# Patient Record
Sex: Male | Born: 1995 | Race: Black or African American | Hispanic: No | Marital: Single | Smoking: Current every day smoker
Health system: Southern US, Community
[De-identification: ages and names within clinical notes are randomized; demographics above are authoritative.]

## PROBLEM LIST (undated history)

## (undated) DIAGNOSIS — K529 Noninfective gastroenteritis and colitis, unspecified: Secondary | ICD-10-CM

## (undated) DIAGNOSIS — Z9089 Acquired absence of other organs: Secondary | ICD-10-CM

## (undated) DIAGNOSIS — R041 Hemorrhage from throat: Secondary | ICD-10-CM

## (undated) DIAGNOSIS — T8859XA Other complications of anesthesia, initial encounter: Secondary | ICD-10-CM

## (undated) DIAGNOSIS — T4145XA Adverse effect of unspecified anesthetic, initial encounter: Secondary | ICD-10-CM

## (undated) DIAGNOSIS — K610 Anal abscess: Secondary | ICD-10-CM

## (undated) DIAGNOSIS — R1084 Generalized abdominal pain: Secondary | ICD-10-CM

## (undated) DIAGNOSIS — K921 Melena: Secondary | ICD-10-CM

## (undated) DIAGNOSIS — I10 Essential (primary) hypertension: Secondary | ICD-10-CM

## (undated) DIAGNOSIS — K611 Rectal abscess: Secondary | ICD-10-CM

## (undated) DIAGNOSIS — J039 Acute tonsillitis, unspecified: Secondary | ICD-10-CM

## (undated) HISTORY — DX: Rectal abscess: K61.1

## (undated) HISTORY — DX: Noninfective gastroenteritis and colitis, unspecified: K52.9

## (undated) HISTORY — DX: Anal abscess: K61.0

## (undated) HISTORY — DX: Melena: K92.1

## (undated) HISTORY — DX: Acquired absence of other organs: Z90.89

## (undated) HISTORY — DX: Generalized abdominal pain: R10.84

## (undated) HISTORY — DX: Hemorrhage from throat: R04.1

## (undated) HISTORY — DX: Essential (primary) hypertension: I10

## (undated) SURGERY — Surgical Case
Anesthesia: *Unknown

---
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
## 2014-01-17 ENCOUNTER — Emergency Department: Payer: Self-pay | Admitting: Emergency Medicine

## 2014-01-17 LAB — URINALYSIS, COMPLETE
BILIRUBIN, UR: NEGATIVE
Bacteria: NONE SEEN
Blood: NEGATIVE
GLUCOSE, UR: NEGATIVE mg/dL (ref 0–75)
Ketone: NEGATIVE
LEUKOCYTE ESTERASE: NEGATIVE
NITRITE: NEGATIVE
PROTEIN: NEGATIVE
Ph: 6 (ref 4.5–8.0)
RBC,UR: 1 /HPF (ref 0–5)
SPECIFIC GRAVITY: 1.024 (ref 1.003–1.030)
Squamous Epithelial: <1

## 2014-01-17 LAB — COMPREHENSIVE METABOLIC PANEL
ALT: 27 U/L (ref 12–78)
ANION GAP: 5 — AB (ref 7–16)
Albumin: 3.6 g/dL — ABNORMAL LOW (ref 3.8–5.6)
Alkaline Phosphatase: 93 U/L
BUN: 13 mg/dL (ref 9–21)
Bilirubin,Total: 0.3 mg/dL (ref 0.2–1.0)
CALCIUM: 9 mg/dL (ref 9.0–10.7)
Chloride: 106 mmol/L (ref 97–107)
Co2: 25 mmol/L (ref 16–25)
Creatinine: 1.22 mg/dL (ref 0.60–1.30)
EGFR (African American): >60
EGFR (Non-African Amer.): >60
GLUCOSE: 82 mg/dL (ref 65–99)
Osmolality: 271 (ref 275–301)
Potassium: 3.9 mmol/L (ref 3.3–4.7)
SGOT(AST): 24 U/L (ref 10–41)
Sodium: 136 mmol/L (ref 132–141)
Total Protein: 7.8 g/dL (ref 6.4–8.6)

## 2014-01-17 LAB — CBC
HCT: 41.6 % (ref 40.0–52.0)
HGB: 13.7 g/dL (ref 13.0–18.0)
MCH: 27.6 pg (ref 26.0–34.0)
MCHC: 33 g/dL (ref 32.0–36.0)
MCV: 84 fL (ref 80–100)
PLATELETS: 392 10*3/uL (ref 150–440)
RBC: 4.99 10*6/uL (ref 4.40–5.90)
RDW: 14.2 % (ref 11.5–14.5)
WBC: 13.5 10*3/uL — ABNORMAL HIGH (ref 3.8–10.6)

## 2014-01-17 LAB — LIPASE, BLOOD: LIPASE: 93 U/L (ref 73–393)

## 2014-04-27 ENCOUNTER — Emergency Department: Payer: Self-pay | Admitting: Emergency Medicine

## 2014-04-27 LAB — MONONUCLEOSIS SCREEN: Mono Test: POSITIVE

## 2014-04-30 LAB — BETA STREP CULTURE(ARMC)

## 2014-05-01 ENCOUNTER — Emergency Department: Payer: Self-pay | Admitting: Emergency Medicine

## 2014-12-03 ENCOUNTER — Observation Stay: Payer: Self-pay | Admitting: Surgery

## 2014-12-03 LAB — CBC WITH DIFFERENTIAL/PLATELET
Basophil #: 0.1 10*3/uL (ref 0.0–0.1)
Basophil %: 0.7 %
EOS ABS: 0 10*3/uL (ref 0.0–0.7)
EOS PCT: 0.1 %
HCT: 47.2 % (ref 40.0–52.0)
HGB: 14.9 g/dL (ref 13.0–18.0)
LYMPHS ABS: 2.7 10*3/uL (ref 1.0–3.6)
Lymphocyte %: 17.5 %
MCH: 27 pg (ref 26.0–34.0)
MCHC: 31.7 g/dL — ABNORMAL LOW (ref 32.0–36.0)
MCV: 85 fL (ref 80–100)
MONO ABS: 1.4 x10 3/mm — AB (ref 0.2–1.0)
Monocyte %: 9.1 %
Neutrophil #: 11.4 10*3/uL — ABNORMAL HIGH (ref 1.4–6.5)
Neutrophil %: 72.6 %
Platelet: 387 10*3/uL (ref 150–440)
RBC: 5.52 10*6/uL (ref 4.40–5.90)
RDW: 13.2 % (ref 11.5–14.5)
WBC: 15.7 10*3/uL — ABNORMAL HIGH (ref 3.8–10.6)

## 2014-12-03 LAB — BASIC METABOLIC PANEL
Anion Gap: 9 (ref 7–16)
BUN: 9 mg/dL (ref 7–18)
CO2: 25 mmol/L (ref 21–32)
CREATININE: 1.24 mg/dL (ref 0.60–1.30)
Calcium, Total: 9.2 mg/dL (ref 9.0–10.7)
Chloride: 104 mmol/L (ref 98–107)
EGFR (Non-African Amer.): >60
GLUCOSE: 103 mg/dL — AB (ref 65–99)
OSMOLALITY: 275 (ref 275–301)
Potassium: 3.4 mmol/L — ABNORMAL LOW (ref 3.5–5.1)
SODIUM: 138 mmol/L (ref 136–145)

## 2014-12-03 LAB — URINALYSIS, COMPLETE
BACTERIA: NONE SEEN
Bilirubin,UR: NEGATIVE
Blood: NEGATIVE
GLUCOSE, UR: NEGATIVE mg/dL (ref 0–75)
Ketone: NEGATIVE
LEUKOCYTE ESTERASE: NEGATIVE
NITRITE: NEGATIVE
Ph: 6 (ref 4.5–8.0)
Protein: NEGATIVE
RBC,UR: 1 /HPF (ref 0–5)
SPECIFIC GRAVITY: 1.018 (ref 1.003–1.030)
Squamous Epithelial: <1

## 2014-12-08 HISTORY — PX: INCISION AND DRAINAGE PERIRECTAL ABSCESS: SHX1804

## 2014-12-09 LAB — WOUND CULTURE

## 2015-03-31 NOTE — H&P (Signed)
Subjective/Chief Complaint perianal pain   History of Present Illness 19 y/o obese male presents with 3 day history of increasing perianal pain and swelling along with painful defecation, no dysuria.  Seen in local urgent care and diagnosed with prostatitis and started on doxycycline.  Broke out in rash after three pills.  Came to ER with persistent pain.  Febrile in ER to 101.6 wbc elevated and CT scan shows small perianal anterior abscess.  Surgery consulted.  No alleviating factors.   Past History obesity   Code Status Full Code   Past Med/Surgical Hx:  Denies medical history:   denies surg hx:   ALLERGIES:  Doxycycline: Tachycardia, SOB, Swelling   Other Allergies none   HOME MEDICATIONS: Medication Instructions Status  doxycycline hyclate hyclate 100 mg oral tablet 1 tab(s) orally 2 times a day x 10 days. *pt is stopping medication due to allergic reaction 12/03/14 [facility]* Active    Medications none   Family and Social History:  Family History Non-Contributory   Social History negative tobacco, negative ETOH, negative Illicit drugs   Place of Living Home   Review of Systems:  Subjective/Chief Complaint see above   Fever/Chills Yes   Abdominal Pain No   Diarrhea No   Constipation No   Nausea/Vomiting No   SOB/DOE No   Chest Pain No   Dysuria No   Tolerating Diet Yes  ate at 1 pm today.   ROS remaining ten point negative.   Medications/Allergies Reviewed Medications/Allergies reviewed   Physical Exam:  GEN no acute distress, obese, disheveled, 101.6 VSS   HEENT pale conjunctivae, PERRL, hearing intact to voice, moist oral mucosa, good dentition   NECK supple  No masses  trachea midline   RESP normal resp effort  clear BS  no use of accessory muscles   CARD regular rate  no murmur   ABD denies tenderness  denies Flank Tenderness  no liver/spleen enlargement  no hernia  soft  normal BS   EXTR negative cyanosis/clubbing, negative edema    SKIN normal to palpation, No rashes   NEURO cranial nerves intact   PSYCH A+O to time, place, person, good insight   Additional Comments tender along anterior edge of rectum and along the anterior perineal body   Lab Results: Routine Chem:  27-Dec-15 18:14   Glucose, Serum  103  BUN 9  Creatinine (comp) 1.24  Sodium, Serum 138  Potassium, Serum  3.4  Chloride, Serum 104  CO2, Serum 25  Calcium (Total), Serum 9.2  Anion Gap 9  Osmolality (calc) 275  eGFR (African American) >60  eGFR (Non-African American) >60 (eGFR values <92m/min/1.73 m2 may be an indication of chronic kidney disease (CKD). Calculated eGFR, using the MRDR Study equation, is useful in  patients with stable renal function. The eGFR calculation will not be reliable in acutely ill patients when serum creatinine is changing rapidly. It is not useful in patients on dialysis. The eGFR calculation may not be applicable to patients at the low and high extremes of body sizes, pregnant women, and vegetarians.)  Routine UA:  27-Dec-15 19:35   Color (UA) Yellow  Clarity (UA) Clear  Glucose (UA) Negative  Bilirubin (UA) Negative  Ketones (UA) Negative  Specific Gravity (UA) 1.018  Blood (UA) Negative  pH (UA) 6.0  Protein (UA) Negative  Nitrite (UA) Negative  Leukocyte Esterase (UA) Negative (Result(s) reported on 03 Dec 2014 at 07:53PM.)  RBC (UA) 1 /HPF  WBC (UA) 1 /HPF  Bacteria (  UA) NONE SEEN  Epithelial Cells (UA) <1 /HPF  Mucous (UA) PRESENT (Result(s) reported on 03 Dec 2014 at 07:53PM.)  Routine Hem:  27-Dec-15 18:14   WBC (CBC)  15.7  RBC (CBC) 5.52  Hemoglobin (CBC) 14.9  Hematocrit (CBC) 47.2  Platelet Count (CBC) 387  MCV 85  MCH 27.0  MCHC  31.7  RDW 13.2  Neutrophil % 72.6  Lymphocyte % 17.5  Monocyte % 9.1  Eosinophil % 0.1  Basophil % 0.7  Neutrophil #  11.4  Lymphocyte # 2.7  Monocyte #  1.4  Eosinophil # 0.0  Basophil # 0.1 (Result(s) reported on 03 Dec 2014 at 06:37PM.)    Radiology Results: LabUnknown:    27-Dec-15 20:09, CT Abdomen and Pelvis With Contrast  PACS Image  CT:  CT Abdomen and Pelvis With Contrast  REASON FOR EXAM:    (1) abdominal/pelvic pain - R abd into r groin; (2)   abdominal/pelvic pain - R ab  COMMENTS:       PROCEDURE: CT  - CT ABDOMEN / PELVIS  W  - Dec 03 2014  8:09PM     CLINICAL DATA:  Right abdomen and pelvic pain extending into right  groin. Currently being treated for prostate infection.    EXAM:  CT ABDOMEN AND PELVIS WITH CONTRAST    TECHNIQUE:  Multidetector CT imaging of the abdomen and pelvis was performed  using the standard protocol following bolus administration of  intravenous contrast.    CONTRAST:  125 cc Omnipaque 300 IV.    COMPARISON:  01/17/2014    FINDINGS:  Lung bases are clear.  No effusions.  Heart is normal size.    Liver, gallbladder, spleen, pancreas, adrenals and kidneys are  normal. Appendix is visualized and is normal. Stomach, large and  small bowel are unremarkable. Aorta is normal caliber. No free  fluid, free air or adenopathy. Urinary bladder is decompressed,  grossly unremarkable.  There is a fluid collection noted anterior to the anus near the  gluteal fold concerning for perianal abscess. A few locules of gas  noted within the fluid collection. This measures approximately 4.4 x  3.9 cm.    No acute bony abnormality.     IMPRESSION:  4.4 cm irregular fluid collection with locules ofgas anterior to  the anus concerning for perianal abscess.      Electronically Signed    By: Rolm Baptise M.D.    On: 12/03/2014 20:32     Verified By: Raelyn Number, M.D.,    Assessment/Admission Diagnosis 19 y/o with perianal abscess   Plan admit, NPO after MN, IV abx, plan I and D in am with Dr Rexene Edison.   Electronic Signatures: Sherri Rad (MD)  (Signed 27-Dec-15 22:23)  Authored: CHIEF COMPLAINT and HISTORY, PAST MEDICAL/SURGIAL HISTORY, ALLERGIES, Other Allergies, HOME  MEDICATIONS, OTHER MEDICATIONS, FAMILY AND SOCIAL HISTORY, REVIEW OF SYSTEMS, PHYSICAL EXAM, LABS, Radiology, ASSESSMENT AND PLAN   Last Updated: 27-Dec-15 22:23 by Sherri Rad (MD)

## 2015-04-04 NOTE — Op Note (Signed)
PATIENT NAME:  Ronnie Smith, Ronnie Smith MR#:  [id number] DATE OF BIRTH:  01/18/1996  DATE OF PROCEDURE:  12/04/2014  SURGEON: Harrell Gave A. Alaisha Eversley, MD  PREOPERATIVE DIAGNOSIS: Perianal abscess.   POSTOPERATIVE DIAGNOSIS: Perianal abscess.   PROCEDURE PERFORMED: Rectal examination under anesthesia, incision and drainage of perineal abscess.   ANESTHESIA: General.   ESTIMATED BLOOD LOSS: 10 mL.   COMPLICATIONS: None.   SPECIMEN: Wound culture for Gram stain and sensitivity.   INDICATION FOR ADMISSION: Ronnie Smith is a pleasant 19 year old with history of prostate infection who presents with groin and perianal pain. He had a CT scan which showed a 5 cm perianal abscess. He was thus brought to the operating room suite for I and D of abscess.   DETAILS OF PROCEDURE: As follows: Informed consent was obtained. Ronnie Smith was brought to the operating room suite. He was induced, endotracheal tube was placed, general anesthesia was administered. His legs were put up in stirrups. His perineum was prepped and draped in standard surgical fashion. An incision was made to the right anterior aspect of his perineum. The incision was deepened down into a pus cavity which appeared to go laterally onto the patient's left side. A Penrose drain was put into the cavity. All loculations were broken up. The cavity was then packed with iodoform gauze and the Penrose drain was sutured with a 3-0 nylon suture. A dressing was placed over the wound. The patient was then awoken, extubated and brought to the postanesthesia care unit. There were no immediate complications. Needle, sponge and instrument count was correct at the end of the procedure.    ____________________________ Glena Norfolk. Akshara Blumenthal, MD cal:TT D: 12/04/2014 17:59:34 ET T: 12/04/2014 21:15:32 ET JOB#: [id number]  cc: Harrell Gave A. Kitai Purdom, MD, <Dictator> Floyde Parkins MD ELECTRONICALLY SIGNED 12/19/2014 19:46

## 2015-04-17 ENCOUNTER — Inpatient Hospital Stay
Admission: EM | Admit: 2015-04-17 | Discharge: 2015-04-23 | DRG: 330 | Disposition: A | Discharge status: Home / Self Care | Payer: BC Managed Care – PPO | Attending: Surgery | Admitting: Surgery

## 2015-04-17 ENCOUNTER — Encounter: Payer: Self-pay | Admitting: Emergency Medicine

## 2015-04-17 DIAGNOSIS — Z881 Allergy status to other antibiotic agents status: Secondary | ICD-10-CM | POA: Diagnosis not present

## 2015-04-17 DIAGNOSIS — K611 Rectal abscess: Secondary | ICD-10-CM

## 2015-04-17 DIAGNOSIS — R509 Fever, unspecified: Secondary | ICD-10-CM

## 2015-04-17 DIAGNOSIS — Z6841 Body Mass Index (BMI) 40.0 and over, adult: Secondary | ICD-10-CM | POA: Diagnosis not present

## 2015-04-17 HISTORY — DX: Rectal abscess: K61.1

## 2015-04-17 LAB — BASIC METABOLIC PANEL
ANION GAP: 6 (ref 5–15)
BUN: 17 mg/dL (ref 6–20)
CO2: 24 mmol/L (ref 22–32)
Calcium: 9 mg/dL (ref 8.9–10.3)
Chloride: 108 mmol/L (ref 101–111)
Creatinine, Ser: 1.18 mg/dL (ref 0.61–1.24)
GFR calc Af Amer: >60 mL/min (ref >60)
GLUCOSE: 97 mg/dL (ref 65–99)
POTASSIUM: 3.8 mmol/L (ref 3.5–5.1)
SODIUM: 138 mmol/L (ref 135–145)

## 2015-04-17 LAB — CBC WITH DIFFERENTIAL/PLATELET
BASOS ABS: 0 10*3/uL (ref 0–0.1)
Basophils Relative: 0 %
Eosinophils Absolute: 0 10*3/uL (ref 0–0.7)
Eosinophils Relative: 0 %
HCT: 47.2 % (ref 40.0–52.0)
HEMOGLOBIN: 15.2 g/dL (ref 13.0–18.0)
LYMPHS PCT: 19 %
Lymphs Abs: 2.1 10*3/uL (ref 1.0–3.6)
MCH: 27.2 pg (ref 26.0–34.0)
MCHC: 32.1 g/dL (ref 32.0–36.0)
MCV: 84.7 fL (ref 80.0–100.0)
Monocytes Absolute: 1.5 10*3/uL — ABNORMAL HIGH (ref 0.2–1.0)
Monocytes Relative: 13 %
NEUTROS ABS: 7.5 10*3/uL — AB (ref 1.4–6.5)
NEUTROS PCT: 68 %
Platelets: 350 10*3/uL (ref 150–440)
RBC: 5.58 MIL/uL (ref 4.40–5.90)
RDW: 13.6 % (ref 11.5–14.5)
WBC: 11.1 10*3/uL — AB (ref 3.8–10.6)

## 2015-04-17 LAB — HEPATIC FUNCTION PANEL
ALBUMIN: 4.6 g/dL (ref 3.5–5.0)
ALT: 32 U/L (ref 17–63)
AST: 25 U/L (ref 15–41)
Alkaline Phosphatase: 67 U/L (ref 38–126)
BILIRUBIN DIRECT: 0.1 mg/dL (ref 0.1–0.5)
Indirect Bilirubin: 0.3 mg/dL (ref 0.3–0.9)
Total Bilirubin: 0.4 mg/dL (ref 0.3–1.2)
Total Protein: 7.6 g/dL (ref 6.5–8.1)

## 2015-04-17 MED ORDER — DEXTROSE 5 % IV SOLN
1.0000 g | Freq: Four times a day (QID) | INTRAVENOUS | Status: DC
  Administered 2015-04-18 – 2015-04-23 (×21): 1 g via INTRAVENOUS
  Filled 2015-04-17 (×29): qty 1

## 2015-04-17 MED ORDER — MORPHINE SULFATE 4 MG/ML IJ SOLN
4.0000 mg | Freq: Once | INTRAMUSCULAR | Status: AC
Start: 2015-04-17 — End: 2015-04-17
  Administered 2015-04-17: 4 mg via INTRAVENOUS

## 2015-04-17 MED ORDER — ACETAMINOPHEN 325 MG PO TABS
650.0000 mg | ORAL_TABLET | Freq: Four times a day (QID) | ORAL | Status: DC | PRN
  Administered 2015-04-18 – 2015-04-21 (×5): 650 mg via ORAL
  Filled 2015-04-17: qty 1
  Filled 2015-04-17 (×5): qty 2

## 2015-04-17 MED ORDER — PIPERACILLIN-TAZOBACTAM 3.375 G IVPB
3.3750 g | Freq: Once | INTRAVENOUS | Status: AC
  Administered 2015-04-17: 3.375 g via INTRAVENOUS

## 2015-04-17 MED ORDER — VANCOMYCIN HCL 10 G IV SOLR
1.0000 g | Freq: Once | INTRAVENOUS | Status: DC
  Filled 2015-04-17: qty 1000

## 2015-04-17 MED ORDER — VANCOMYCIN HCL IN DEXTROSE 1-5 GM/200ML-% IV SOLN
INTRAVENOUS | Status: AC
  Administered 2015-04-17: 1000 mg via INTRAVENOUS
  Filled 2015-04-17: qty 200

## 2015-04-17 MED ORDER — PIPERACILLIN-TAZOBACTAM 3.375 G IVPB
INTRAVENOUS | Status: AC
  Administered 2015-04-17: 3.375 g via INTRAVENOUS
  Filled 2015-04-17: qty 50

## 2015-04-17 MED ORDER — MORPHINE SULFATE 4 MG/ML IJ SOLN
4.0000 mg | INTRAMUSCULAR | Status: DC | PRN
  Administered 2015-04-18 – 2015-04-20 (×8): 4 mg via INTRAVENOUS
  Filled 2015-04-17 (×8): qty 1

## 2015-04-17 MED ORDER — SODIUM CHLORIDE 0.9 % IV SOLN
INTRAVENOUS | Status: DC
  Administered 2015-04-17 (×3): via INTRAVENOUS

## 2015-04-17 MED ORDER — HEPARIN SODIUM (PORCINE) 5000 UNIT/ML IJ SOLN
5000.0000 [IU] | Freq: Three times a day (TID) | INTRAMUSCULAR | Status: DC
  Administered 2015-04-17 – 2015-04-23 (×16): 5000 [IU] via SUBCUTANEOUS
  Filled 2015-04-17 (×18): qty 1

## 2015-04-17 MED ORDER — MORPHINE SULFATE 4 MG/ML IJ SOLN: 4.0000 mg | Freq: Once | INTRAMUSCULAR | Status: DC

## 2015-04-17 MED ORDER — SODIUM CHLORIDE 0.9 % IV SOLN: INTRAVENOUS | Status: DC

## 2015-04-17 MED ORDER — ACETAMINOPHEN 325 MG PO TABS
650.0000 mg | ORAL_TABLET | Freq: Once | ORAL | Status: AC
  Administered 2015-04-17: 650 mg via ORAL

## 2015-04-17 MED ORDER — ACETAMINOPHEN 650 MG RE SUPP: 650.0000 mg | Freq: Four times a day (QID) | RECTAL | Status: DC | PRN

## 2015-04-17 MED ORDER — LACTATED RINGERS IV SOLN
INTRAVENOUS | Status: DC
  Administered 2015-04-18 (×2): via INTRAVENOUS

## 2015-04-17 MED ORDER — ACETAMINOPHEN 325 MG PO TABS
ORAL_TABLET | ORAL | Status: AC
  Administered 2015-04-17: 650 mg via ORAL
  Filled 2015-04-17: qty 2

## 2015-04-17 MED ORDER — MORPHINE SULFATE 4 MG/ML IJ SOLN
INTRAMUSCULAR | Status: AC
  Filled 2015-04-17: qty 1

## 2015-04-17 MED ORDER — MORPHINE SULFATE 4 MG/ML IJ SOLN
INTRAMUSCULAR | Status: AC
  Administered 2015-04-17: 4 mg via INTRAVENOUS
  Filled 2015-04-17: qty 1

## 2015-04-17 MED ORDER — ONDANSETRON HCL 4 MG/2ML IJ SOLN
4.0000 mg | Freq: Once | INTRAMUSCULAR | Status: AC
  Administered 2015-04-17: 4 mg via INTRAVENOUS

## 2015-04-17 MED ORDER — ONDANSETRON HCL 4 MG/2ML IJ SOLN
INTRAMUSCULAR | Status: AC
  Filled 2015-04-17: qty 2

## 2015-04-17 MED ORDER — VANCOMYCIN HCL IN DEXTROSE 1-5 GM/200ML-% IV SOLN
1000.0000 mg | Freq: Once | INTRAVENOUS | Status: AC
  Administered 2015-04-17: 1000 mg via INTRAVENOUS

## 2015-04-17 MED ORDER — OXYCODONE-ACETAMINOPHEN 5-325 MG PO TABS
1.0000 | ORAL_TABLET | ORAL | Status: DC | PRN
  Administered 2015-04-18 – 2015-04-20 (×2): 1 via ORAL
  Filled 2015-04-17 (×2): qty 1

## 2015-04-17 MED ORDER — ONDANSETRON HCL 4 MG/2ML IJ SOLN
INTRAMUSCULAR | Status: AC
  Administered 2015-04-17: 4 mg via INTRAVENOUS
  Filled 2015-04-17: qty 2

## 2015-04-17 MED ORDER — ONDANSETRON HCL 4 MG/2ML IJ SOLN: 4.0000 mg | Freq: Once | INTRAMUSCULAR | Status: DC

## 2015-04-17 NOTE — ED Notes (Signed)
Pt reports that he has an abscess in his perirectal area, he has had this three times and has had surgery all three times.

## 2015-04-17 NOTE — ED Provider Notes (Signed)
[facility] Emergency Department Provider Note  ____________________________________________  Time seen: Approximately 3:20 PM  I have reviewed the triage vital signs and the nursing notes.   HISTORY  Chief Complaint Abscess    HPI Ronnie Smith is a 19 y.o. male who is brought to the emergency department by his mother with a chief complaint of fever and pain and pain in the perirectal area.  Pain is increased on the left side of the rectum onset of pain was 3 days ago pain is rated as 8 out of 10 in severity, pain is constant, pain is sharp and aching.  Patient has a history of a perirectal abscess on the right side of the rectum diagnosed in December 2015 which required surgical resection and insertion of drain. He was hospitalized through December to January. He had a train that was removed and changed to another area on an outpatient basis by the surgeon. The second drain was placed 3 weeks ago by the surgeon. The patient removed the drain himself because it was not draining any more fluid.  3 days ago he started to have this increased pain. This is associated with fever. And nausea and vomiting. He is also complaining of sweats and chills for the past 2 days.   PMH: Perirectal abcess    There are no active problems to display for this patient.   PSH: Perirectal abcess  No current outpatient prescriptions on file.  Allergies Doxycycline  No family history on file.  Social History History  Substance Use Topics  . Smoking status: Never Smoker   . Smokeless tobacco: Not on file  . Alcohol Use: No    Review of Systems Constitutional: No fever/chills Eyes: No visual changes. ENT: No sore throat. Cardiovascular: Denies chest pain. Respiratory: Denies shortness of breath. Gastrointestinal: No abdominal pain. Positive nausea and vomiting for 2 days.  Positive diarrhea.  No constipation. Genitourinary: Negative for  dysuria. Musculoskeletal: Negative for back pain. Skin: Negative for rash. Neurological: Negative for headaches, focal weakness or numbness.  10-point ROS otherwise negative.  ____________________________________________   PHYSICAL EXAM:  VITAL SIGNS: ED Triage Vitals  Enc Vitals Group     BP --      Pulse Rate 04/17/15 1032 112     Resp --      Temp 04/17/15 1032 100.7 F (38.2 C)     Temp Source 04/17/15 1032 Oral     SpO2 04/17/15 1032 100 %     Weight 04/17/15 1003 323 lb (146.512 kg)     Height 04/17/15 1003 6\' 1"  (1.854 m)     Head Cir --      Peak Flow --      Pain Score 04/17/15 1003 8     Pain Loc --      Pain Edu? --      Excl. in [place]? --   Show vital signs on arrival to the triage area revealed temperature of 100.7 patient is tachycardic at 112 O2 sat is normal at 100%.  In the emergency department his temperature rose to 103.1 and his heart rate went up to 1:30 consistent with tachycardia and fever.   Constitutional: Alert and oriented. Appears sick in mild acute distress. Diaphoretic. Eyes: Conjunctivae are normal. PERRL. EOMI. Head: Atraumatic. Nose: No congestion/rhinnorhea. Mouth/Throat: Mucous membranes are moist.  Oropharynx non-erythematous. Neck: No stridor.  Cardiovascular: Normal rate, regular rhythm. Grossly normal heart sounds.  Good peripheral circulation. Respiratory: Normal respiratory effort.  No retractions. Lungs  CTAB. Gastrointestinal: Soft and nontender. No distention. No abdominal bruits. No CVA tenderness. Musculoskeletal: No lower extremity tenderness nor edema.  No joint effusions. Neurologic:  Normal speech and language. No gross focal neurologic deficits are appreciated. Speech is normal. No gait instability. Skin:  Skin is warm, dry and intact. No rash noted. Psychiatric: Mood and affect are normal. Speech and behavior are normal. Rectal slight tenderness in the left perirectal area. No redness not hard or in indurated. No  drainage. ____________________________________________   LABS (all labs ordered are listed, but only abnormal results are displayed)  Labs Reviewed  CBC WITH DIFFERENTIAL/PLATELET - Abnormal; Notable for the following:    WBC 11.1 (*)    Neutro Abs 7.5 (*)    Monocytes Absolute 1.5 (*)    All other components within normal limits  BASIC METABOLIC PANEL   Labs Reviewed  CBC WITH DIFFERENTIAL/PLATELET - Abnormal; Notable for the following:    WBC 11.1 (*)    Neutro Abs 7.5 (*)    Monocytes Absolute 1.5 (*)    All other components within normal limits  CULTURE, BLOOD (ROUTINE X 2)  CULTURE, BLOOD (ROUTINE X 2)  BASIC METABOLIC PANEL  HEPATIC FUNCTION PANEL   Labs Reviewed  CBC WITH DIFFERENTIAL/PLATELET - Abnormal; Notable for the following:    WBC 11.1 (*)    Neutro Abs 7.5 (*)    Monocytes Absolute 1.5 (*)    All other components within normal limits  CULTURE, BLOOD (ROUTINE X 2)  CULTURE, BLOOD (ROUTINE X 2)  BASIC METABOLIC PANEL  HEPATIC FUNCTION PANEL  BASIC METABOLIC PANEL  CBC   labs were reviewed in the emergency department blood cultures were drawn and are pending his liver function tests are normal a sick metabolic panel was normal CBC is positive for an elevated white count of 11.1 with a shift to the left.  EKG ED ECG REPORT   Date: 04/17/2015  EKG Time: 16:44  Rate: 120 sick  Rhythm: normal EKG, normal sinus rhythm, unchanged from previous tracings, sinus tachycardia  Axis: Normal  Intervals:none  ST&T Change: Nonspecific ST-T wave changes.   ____________________________________________  RADIOLOG's  CAT scan of the abdomen and pelvis with contrast was ordered by me in the emergency department as per Dr. Burt Knack, surgeon, request this order was [place]. Dr. Burt Knack did not feel that he required a CT of the abdomen and pelvis in the ED.  _________________________________________   PROCEDURES  Procedure(s) performed: None  Critical Care performed:  No  ____________________________________________   INITIAL IMPRESSION / ASSESSMENT AND PLAN / ED COURSE  Pertinent labs & imaging results that were available during my care of the patient were reviewed by me and considered in the treatment plan for this pt.      19 year old male is accompanied by his mother in the emergency department and presents with a chief complaint of pain in the rectal area with a temperature that is elevated, chills, nausea, vomiting for the past 3 days. He had a recent admission with surgical drainage of a perirectal abscess and had a drain that he removed on his own from the area several weeks ago.  In the ER he received IV fluid and antipyretics, blood cultures were obtained and broad-spectrum antibiotics were ordered and administered to include vancomycin IV and Zosyn IV.  He received multiple doses of IV antiemetic, Zofran 4 mg 2 doses. He received multiple doses of IV analgesic pain medication morphine 4 mg IV 2 doses in the emergency department.  This pain was adequately treated in the ED.   A CT of the abdomen and pelvis with contrast was ordered to determine the nature of the positive possible abscess in the perirectal area and the extension of such. Dr. Burt Knack was called in consultation he is the surgeon.   He requested that I canceled the CT of the abdomen and pelvis since the patient was going to be admitted to the hospital for further evaluation and treatment.   ____________________________________________   FINAL CLINICAL IMPRESSION(S) / ED DIAGNOSES  Final diagnoses:  Perirectal abscess  Fever and chills      Boris Lown, DO 04/17/15 1821

## 2015-04-17 NOTE — ED Notes (Signed)
Pt repots itching beginning 10 minutes ago. Thsi RN assessed pt and airway is intact and pt in no acute distress at this time. Floor RN informed of situation.

## 2015-04-17 NOTE — H&P (Signed)
Ronnie Smith is an 19 y.o. male.   Chief Complaint: Perirectal pain HPI: This is a 19 year old morbidly obese male patient with a history of prior perirectal abscesses. He had drainages performed in December and then again 3 weeks ago and removed his drains himself. These drains are been done by Dr. Rexene Edison according to the patient but he is not certain of that. He has had fevers and has had worsening pain over the last 2 days. He has had no nausea or vomiting.   History reviewed. No pertinent past medical history.  No past surgical history on file. prior incision and drainages of perirectal abscesses  No family history on file. Social History:  reports that he has never smoked. He does not have any smokeless tobacco history on file. He reports that he does not drink alcohol. His drug history is not on file.  Allergies:  Allergies  Allergen Reactions  . Doxycycline Rash     (Not in a hospital admission)   Review of Systems:   Review of Systems  Constitutional: Positive for fever and chills. Negative for weight loss.  HENT: Negative.   Eyes: Negative.   Respiratory: Negative for cough.   Cardiovascular: Negative for chest pain.  Gastrointestinal: Negative for heartburn and abdominal pain.  Genitourinary: Negative for dysuria.  Musculoskeletal: Negative.   Skin: Negative for rash.  Neurological: Negative.  Negative for headaches.  Endo/Heme/Allergies: Negative.   Psychiatric/Behavioral: Negative.     Physical Exam:  Physical Exam  Constitutional: He is oriented to person, place, and time.  Morbidly obese  HENT:  Head: Normocephalic.  Eyes: Pupils are equal, round, and reactive to light.  Neck: Normal range of motion. Neck supple.  Cardiovascular: Normal rate.   Pulmonary/Chest: No respiratory distress.  Abdominal: Soft. He exhibits no distension.  Genitourinary:  Scar present Left perirectal area, minimal tenderness, no erythema No induration   Musculoskeletal: Normal range of motion.  Neurological: He is alert and oriented to person, place, and time.  Skin: Skin is warm.  Psychiatric: Affect normal.    Blood pressure 136/84, pulse 135, temperature 103.1 F (39.5 C), temperature source Oral, resp. rate 21, height _0  (1.854 m), weight 150.4 kg (331 lb 9.2 oz), SpO2 96 %.    Results for orders placed or performed during the hospital encounter of 04/17/15 (from the past 48 hour(s))  Basic metabolic panel     Status: None   Collection Time: 04/17/15 10:43 AM  Result Value Ref Range   Sodium 138 135 - 145 mmol/L   Potassium 3.8 3.5 - 5.1 mmol/L   Chloride 108 101 - 111 mmol/L   CO2 24 22 - 32 mmol/L   Glucose, Bld 97 65 - 99 mg/dL   BUN 17 6 - 20 mg/dL   Creatinine, Ser 1.18 0.61 - 1.24 mg/dL   Calcium 9.0 8.9 - 10.3 mg/dL   GFR calc non Af Amer >60 >60 mL/min   GFR calc Af Amer >60 >60 mL/min    Comment: (NOTE) The eGFR has been calculated using the CKD EPI equation. This calculation has not been validated in all clinical situations. eGFR's persistently <60 mL/min signify possible Chronic Kidney Disease.    Anion gap 6 5 - 15  CBC with Differential     Status: Abnormal   Collection Time: 04/17/15 10:43 AM  Result Value Ref Range   WBC 11.1 (H) 3.8 - 10.6 K/uL   RBC 5.58 4.40 - 5.90 MIL/uL   Hemoglobin 15.2 13.0 -  18.0 g/dL   HCT 47.2 40.0 - 52.0 %   MCV 84.7 80.0 - 100.0 fL   MCH 27.2 26.0 - 34.0 pg   MCHC 32.1 32.0 - 36.0 g/dL   RDW 13.6 11.5 - 14.5 %   Platelets 350 150 - 440 K/uL   Neutrophils Relative % 68 %   Neutro Abs 7.5 (H) 1.4 - 6.5 K/uL   Lymphocytes Relative 19 %   Lymphs Abs 2.1 1.0 - 3.6 K/uL   Monocytes Relative 13 %   Monocytes Absolute 1.5 (H) 0.2 - 1.0 K/uL   Eosinophils Relative 0 %   Eosinophils Absolute 0.0 0 - 0.7 K/uL   Basophils Relative 0 %   Basophils Absolute 0.0 0 - 0.1 K/uL  Hepatic function panel     Status: None   Collection Time: 04/17/15  4:12 PM  Result Value Ref  Range   Total Protein 7.6 6.5 - 8.1 g/dL   Albumin 4.6 3.5 - 5.0 g/dL   AST 25 15 - 41 U/L   ALT 32 17 - 63 U/L   Alkaline Phosphatase 67 38 - 126 U/L   Total Bilirubin 0.4 0.3 - 1.2 mg/dL   Bilirubin, Direct 0.1 0.1 - 0.5 mg/dL   Indirect Bilirubin 0.3 0.3 - 0.9 mg/dL   No results found.   Assessment/Plan Perirectal abscess with recurrence. 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 He is febrile and tachycardic. I have recommended admission to the hospital IV fluids. And IV antibiotics to be administered. The rationale for this was discussed with he and his mother. This may result in the need for incision and drainage should this not improve rapidly. If failure to improve on IV antibiotics is not forthcoming then surgical drainage may be required depending on reexamination.  Florene Glen, MD, FACS

## 2015-04-18 LAB — CBC
HCT: 43.1 % (ref 40.0–52.0)
Hemoglobin: 13.9 g/dL (ref 13.0–18.0)
MCH: 27.3 pg (ref 26.0–34.0)
MCHC: 32.3 g/dL (ref 32.0–36.0)
MCV: 84.7 fL (ref 80.0–100.0)
PLATELETS: 278 10*3/uL (ref 150–440)
RBC: 5.09 MIL/uL (ref 4.40–5.90)
RDW: 13.3 % (ref 11.5–14.5)
WBC: 9.2 10*3/uL (ref 3.8–10.6)

## 2015-04-18 LAB — BASIC METABOLIC PANEL
ANION GAP: 7 (ref 5–15)
BUN: 13 mg/dL (ref 6–20)
CALCIUM: 8.3 mg/dL — AB (ref 8.9–10.3)
CO2: 26 mmol/L (ref 22–32)
Chloride: 103 mmol/L (ref 101–111)
Creatinine, Ser: 1.32 mg/dL — ABNORMAL HIGH (ref 0.61–1.24)
GFR calc non Af Amer: >60 mL/min (ref >60)
Glucose, Bld: 95 mg/dL (ref 65–99)
Potassium: 3.8 mmol/L (ref 3.5–5.1)
SODIUM: 136 mmol/L (ref 135–145)

## 2015-04-18 MED ORDER — LACTATED RINGERS IV SOLN
INTRAVENOUS | Status: DC
  Administered 2015-04-18 – 2015-04-20 (×6): via INTRAVENOUS

## 2015-04-18 NOTE — Progress Notes (Signed)
CC: Perianal pain Subjective: Patient states his pain is no better but no worse either. He denies any further fever or chills. He has not had a bowel movement and has had no nausea or vomiting. He is tolerating clear liquid diet.  Objective: Vital signs in last 24 hours: Temp:  [98.6 F (37 C)-103.1 F (39.5 C)] 98.6 F (37 C) (05/11 0758) Pulse Rate:  [87-135] 87 (05/11 0758) Resp:  [17-31] 17 (05/11 0758) BP: (111-136)/(60-97) 123/80 mmHg (05/11 0758) SpO2:  [92 %-100 %] 98 % (05/11 0758) Weight:  [146.512 kg (323 lb)-156.173 kg (344 lb 4.8 oz)] 156.173 kg (344 lb 4.8 oz) (05/11 0500)    Intake/Output from previous day: 05/10 0701 - 05/11 0700 In: 1570 [P.O.:300; I.V.:1270] Out: 600 [Urine:600] Intake/Output this shift:    Physical exam:  Comfortable-appearing male patient, morbidly obese.  Left perianal area with minimal tenderness no induration no erythema and no drainage.  Calves are nontender  Lab Results: CBC   Recent Labs  04/17/15 1043 04/18/15 0657  WBC 11.1* 9.2  HGB 15.2 13.9  HCT 47.2 43.1  PLT 350 278   BMET  Recent Labs  04/17/15 1043 04/18/15 0657  NA 138 136  K 3.8 3.8  CL 108 103  CO2 24 26  GLUCOSE 97 95  BUN 17 13  CREATININE 1.18 1.32*  CALCIUM 9.0 8.3*   PT/INR No results for input(s): LABPROT, INR in the last 72 hours. ABG No results for input(s): PHART, HCO3 in the last 72 hours.  Invalid input(s): PCO2, PO2  Studies/Results: No results found.  Anti-infectives: Anti-infectives    Start     Dose/Rate Route Frequency Ordered Stop   04/17/15 1800  cefOXitin (MEFOXIN) 1 g in dextrose 5 % 50 mL IVPB     1 g 100 mL/hr over 30 Minutes Intravenous 4 times per day 04/17/15 1731     04/17/15 1645  vancomycin (VANCOCIN) IVPB 1000 mg/200 mL premix     1,000 mg 200 mL/hr over 60 Minutes Intravenous  Once 04/17/15 1636 04/17/15 1833   04/17/15 1545  piperacillin-tazobactam (ZOSYN) IVPB 3.375 g     3.375 g 12.5 mL/hr over 240  Minutes Intravenous  Once 04/17/15 1540 04/17/15 1700   04/17/15 1545  vancomycin (VANCOCIN) injection 1 g  Status:  Discontinued     1 g Intravenous  Once 04/17/15 1540 04/17/15 1635      Assessment/Plan: s/p    This is a patient with a history of recurrent perirectal abscesses. This likely represents a recurrent perirectal abscess now. However the patient's heart rate has returned to normal and he  has been afebrile since admission my plan would be to continue IV antibiotics. If he worsens or fails to improve considerably then general anesthetic, examination under anesthesia, and possible incision and drainage will be warranted. This will be reassessed tomorrow morning after another 24 hours of antibiotics are in place.  Florene Glen, MD, FACS  04/18/2015

## 2015-04-18 NOTE — Progress Notes (Signed)
Informed Dr. Burt Knack that pt has c/o cramping chest pain in Right Upper Chest and Midline Chest behind sternum.  Pt denies radiation.  Pt denies SOB.  Pt calm.    Pt VS have been checked and there is a noticeable increase from previous VS (See Flow sheet), including an increase in systolic and diastolic BP, and an increase in HR (pt is tachycardiac).  Pt SPO2 is 99% on RA.    STAT EKG ordered. Fluids Decreased.  Continue to monitor.  See VSS flow sheet for updates.    Will continue to monitor and notify MD of any changes.

## 2015-04-19 ENCOUNTER — Inpatient Hospital Stay: Payer: BC Managed Care – PPO | Admitting: Anesthesiology

## 2015-04-19 ENCOUNTER — Encounter
Admission: EM | Disposition: A | Discharge status: Home / Self Care | Payer: Self-pay | Source: Home / Self Care | Attending: Surgery

## 2015-04-19 ENCOUNTER — Encounter: Payer: Self-pay | Admitting: Anesthesiology

## 2015-04-19 HISTORY — PX: INCISION AND DRAINAGE PERIRECTAL ABSCESS: SHX1804

## 2015-04-19 LAB — CBC
HCT: 43.7 % (ref 40.0–52.0)
Hemoglobin: 14 g/dL (ref 13.0–18.0)
MCH: 27.3 pg (ref 26.0–34.0)
MCHC: 32 g/dL (ref 32.0–36.0)
MCV: 85.2 fL (ref 80.0–100.0)
PLATELETS: 282 10*3/uL (ref 150–440)
RBC: 5.13 MIL/uL (ref 4.40–5.90)
RDW: 13.3 % (ref 11.5–14.5)
WBC: 7.4 10*3/uL (ref 3.8–10.6)

## 2015-04-19 SURGERY — INCISION AND DRAINAGE, ABSCESS, PERIRECTAL
Anesthesia: General

## 2015-04-19 MED ORDER — MENTHOL 3 MG MT LOZG
1.0000 | LOZENGE | OROMUCOSAL | Status: DC | PRN
  Administered 2015-04-19: 3 mg via ORAL
  Filled 2015-04-19: qty 9

## 2015-04-19 MED ORDER — NEOSTIGMINE METHYLSULFATE 10 MG/10ML IV SOLN
INTRAVENOUS | Status: DC | PRN
  Administered 2015-04-19: 5 mg via INTRAVENOUS

## 2015-04-19 MED ORDER — LIDOCAINE HCL (CARDIAC) 20 MG/ML IV SOLN
INTRAVENOUS | Status: DC | PRN
  Administered 2015-04-19: 40 mg via INTRAVENOUS

## 2015-04-19 MED ORDER — ONDANSETRON HCL 4 MG/2ML IJ SOLN
INTRAMUSCULAR | Status: AC
  Filled 2015-04-19: qty 2

## 2015-04-19 MED ORDER — FENTANYL CITRATE (PF) 100 MCG/2ML IJ SOLN: 25.0000 ug | INTRAMUSCULAR | Status: DC | PRN

## 2015-04-19 MED ORDER — FENTANYL CITRATE (PF) 100 MCG/2ML IJ SOLN
INTRAMUSCULAR | Status: DC | PRN
  Administered 2015-04-19 (×2): 50 ug via INTRAVENOUS
  Administered 2015-04-19: 100 ug via INTRAVENOUS
  Administered 2015-04-19: 50 ug via INTRAVENOUS

## 2015-04-19 MED ORDER — ONDANSETRON HCL 4 MG/2ML IJ SOLN: 4.0000 mg | Freq: Once | INTRAMUSCULAR | Status: DC | PRN

## 2015-04-19 MED ORDER — ROCURONIUM BROMIDE 100 MG/10ML IV SOLN
INTRAVENOUS | Status: DC | PRN
  Administered 2015-04-19: 35 mg via INTRAVENOUS

## 2015-04-19 MED ORDER — DEXAMETHASONE SODIUM PHOSPHATE 4 MG/ML IJ SOLN
4.0000 mg | Freq: Once | INTRAMUSCULAR | Status: AC
  Administered 2015-04-19: 4 mg via INTRAVENOUS
  Filled 2015-04-19: qty 1

## 2015-04-19 MED ORDER — PROPOFOL 10 MG/ML IV BOLUS
INTRAVENOUS | Status: DC | PRN
  Administered 2015-04-19: 200 mg via INTRAVENOUS

## 2015-04-19 MED ORDER — CEFOXITIN SODIUM 1 G IV SOLR
1.0000 g | Freq: Once | INTRAVENOUS | Status: AC
  Administered 2015-04-19: 1 g via INTRAVENOUS
  Filled 2015-04-19: qty 1

## 2015-04-19 MED ORDER — GLYCOPYRROLATE 0.2 MG/ML IJ SOLN
INTRAMUSCULAR | Status: DC | PRN
  Administered 2015-04-19: .8 mg via INTRAVENOUS

## 2015-04-19 MED ORDER — SUCCINYLCHOLINE CHLORIDE 20 MG/ML IJ SOLN
INTRAMUSCULAR | Status: DC | PRN
  Administered 2015-04-19: 120 mg via INTRAVENOUS

## 2015-04-19 MED ORDER — MIDAZOLAM HCL 2 MG/2ML IJ SOLN
INTRAMUSCULAR | Status: DC | PRN
  Administered 2015-04-19: 2 mg via INTRAVENOUS

## 2015-04-19 MED ORDER — ONDANSETRON HCL 4 MG/2ML IJ SOLN
4.0000 mg | Freq: Four times a day (QID) | INTRAMUSCULAR | Status: DC | PRN
  Administered 2015-04-21: 4 mg via INTRAVENOUS
  Filled 2015-04-19: qty 2

## 2015-04-19 SURGICAL SUPPLY — 24 items
BLADE SURG 15 STRL LF DISP TIS (BLADE) ×1 IMPLANT
BLADE SURG 15 STRL SS (BLADE) ×1
BLADE SURG SZ11 CARB STEEL (BLADE) IMPLANT
BRIEF STRETCH MATERNITY 2XLG (MISCELLANEOUS) ×2 IMPLANT
CANISTER SUCT 1200ML W/VALVE (MISCELLANEOUS) ×2 IMPLANT
DRAIN PENROSE 1/4X12 LTX (DRAIN) ×4 IMPLANT
DRAPE LAPAROTOMY 100X77 ABD (DRAPES) IMPLANT
DRAPE LEGGINS SURG 28X43 STRL (DRAPES) IMPLANT
DRAPE UNDER BUTTOCK W/FLU (DRAPES) IMPLANT
GAUZE IODOFORM PACK 1/2 7832 (GAUZE/BANDAGES/DRESSINGS) IMPLANT
GAUZE SPONGE 4X4 12PLY STRL (GAUZE/BANDAGES/DRESSINGS) IMPLANT
GLOVE BIO SURGEON STRL SZ8 (GLOVE) ×4 IMPLANT
GOWN STRL REUS W/ TWL LRG LVL3 (GOWN DISPOSABLE) ×2 IMPLANT
GOWN STRL REUS W/TWL LRG LVL3 (GOWN DISPOSABLE) ×2
KIT RM TURNOVER STRD PROC AR (KITS) ×2 IMPLANT
LABEL OR SOLS (LABEL) IMPLANT
NS IRRIG 500ML POUR BTL (IV SOLUTION) ×2 IMPLANT
PACK BASIN MINOR ARMC (MISCELLANEOUS) ×2 IMPLANT
PAD ABD DERMACEA PRESS 5X9 (GAUZE/BANDAGES/DRESSINGS) ×4 IMPLANT
PREP PVP WINGED SPONGE (MISCELLANEOUS) IMPLANT
SUT ETH BLK MONO 3 0 FS 1 12/B (SUTURE) IMPLANT
SUT NYLON 2-0 (SUTURE) ×2 IMPLANT
SWAB CULTURE AMIES ANAERIB BLU (MISCELLANEOUS) ×2 IMPLANT
SYR BULB IRRIG 60ML STRL (SYRINGE) ×2 IMPLANT

## 2015-04-19 NOTE — Anesthesia Procedure Notes (Signed)
Procedure Name: Intubation Performed by: Angelik Walls Pre-anesthesia Checklist: Patient identified, Emergency Drugs available, Suction available and Patient being monitored Patient Re-evaluated:Patient Re-evaluated prior to inductionOxygen Delivery Method: Circle system utilized Preoxygenation: Pre-oxygenation with 100% oxygen Intubation Type: IV induction Ventilation: Mask ventilation without difficulty Laryngoscope Size: Mac and 4 Grade View: Grade II Tube type: Oral Tube size: 7.0 mm Number of attempts: 1 Airway Equipment and Method: Stylet

## 2015-04-19 NOTE — Anesthesia Preprocedure Evaluation (Addendum)
Anesthesia Evaluation  Patient identified by MRN, date of birth, ID band Patient awake    Reviewed: NPO status , Patient's Chart, lab work & pertinent test results  Airway Mallampati: II  TM Distance: >3 FB Neck ROM: Full    Dental  (+) Chipped   Pulmonary neg pulmonary ROS,  breath sounds clear to auscultation  Pulmonary exam normal       Cardiovascular Exercise Tolerance: Good negative cardio ROS  Rhythm:Regular Rate:Normal - Diastolic murmurs    Neuro/Psych negative neurological ROS  negative psych ROS   GI/Hepatic Neg liver ROS, Perirectal abcess   Endo/Other  negative endocrine ROS  Renal/GU negative Renal ROS  negative genitourinary   Musculoskeletal negative musculoskeletal ROS (+)   Abdominal (+) + obese,  Abdomen: tender.  Morbid obesity  Peds negative pediatric ROS (+)  Hematology negative hematology ROS (+)   Anesthesia Other Findings   Reproductive/Obstetrics negative OB ROS                            Anesthesia Physical Anesthesia Plan  ASA: II  Anesthesia Plan: General   Post-op Pain Management:    Induction: Intravenous, Rapid sequence and Cricoid pressure planned  Airway Management Planned: Oral ETT  Additional Equipment:   Intra-op Plan:   Post-operative Plan: Extubation in OR  Informed Consent: I have reviewed the patients History and Physical, chart, labs and discussed the procedure including the risks, benefits and alternatives for the proposed anesthesia with the patient or authorized representative who has indicated his/her understanding and acceptance.   Dental advisory given  Plan Discussed with:   Anesthesia Plan Comments:         Anesthesia Quick Evaluation

## 2015-04-19 NOTE — Transfer of Care (Signed)
Immediate Anesthesia Transfer of Care Note  Patient: Ronnie Smith  Procedure(s) Performed: Procedure(s): IRRIGATION AND DEBRIDEMENT PERIRECTAL ABSCESS (N/A)  Patient Location: PACU  Anesthesia Type:General  Level of Consciousness: awake, alert  and patient cooperative  Airway & Oxygen Therapy: Patient Spontanous Breathing and Patient connected to face mask oxygen  Post-op Assessment: Report given to RN and Post -op Vital signs reviewed and stable  Post vital signs: Reviewed and stable  Last Vitals:  Filed Vitals:   04/19/15 1405  BP: 127/85  Pulse: 65  Temp:   Resp: 13    Complications: No apparent anesthesia complications

## 2015-04-19 NOTE — Anesthesia Postprocedure Evaluation (Signed)
  Anesthesia Post-op Note  Patient: Ronnie Smith  Procedure(s) Performed: Procedure(s): IRRIGATION AND DEBRIDEMENT PERIRECTAL ABSCESS (N/A)  Anesthesia type:General  Patient location: PACU  Post pain: Pain level controlled  Post assessment: Post-op Vital signs reviewed, Patient's Cardiovascular Status Stable, Respiratory Function Stable, Patent Airway and No signs of Nausea or vomiting  Post vital signs: Reviewed and stable  Last Vitals:  Filed Vitals:   04/19/15 1517  BP: 118/74  Pulse: 88  Temp: 36.9 C  Resp: 17    Level of consciousness: awake, alert  and patient cooperative  Complications: No apparent anesthesia complications

## 2015-04-19 NOTE — Progress Notes (Signed)
Preoperative Review   Patient is met in the preoperative holding area. The history is reviewed in the chart and with the patient. I personally reviewed the options and rationale as well as the risks of this procedure that have been previously discussed with the patient. All questions asked by the patient and/or family were answered to their satisfaction.  Patient agrees to proceed with this procedure at this time.  I also discussed the plan with the patient's mother by telephone. She had questioned why no CT scan was performed. I reviewed for her that no CT was needed in this patient by my judgment. I reminded the patient's mother that there was no guarantee that this would not recur. She had asked or stated that she did not want this to come back again. I reminded her that it already recurred this being the third time. And I cannot give any guarantees that it would not recur again. Questions were answered for her.  I then re-reviewed the plans with the patient himself in the preop holding area and I discussed for him the fact that I had spoken to his mother. Risks were reviewed again in detail. He understood and agreed with this plan.  Florene Glen M.D. FACS

## 2015-04-19 NOTE — Progress Notes (Signed)
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 He has had multiple drainages in the past and his pain is consistent with a recurrence. Of note his pain is no better or no worse today. He remains afebrile and his white blood cell count has been normal.  Objective: Vital signs in last 24 hours: Temp:  [98.9 F (37.2 C)-100.8 F (38.2 C)] 98.9 F (37.2 C) (05/12 0712) Pulse Rate:  [95-105] 95 (05/12 0712) Resp:  [16-18] 16 (05/12 0712) BP: (118-140)/(59-80) 124/79 mmHg (05/12 0712) SpO2:  [97 %-100 %] 100 % (05/12 0712) Weight:  [155.947 kg (343 lb 12.8 oz)] 155.947 kg (343 lb 12.8 oz) (05/12 0408)    Intake/Output from previous day: 05/11 0701 - 05/12 0700 In: 4804 [P.O.:840; I.V.:3964] Out: 2550 [Urine:2550] Intake/Output this shift:    Physical exam:  Morbidly obese patient  No acute distress  Minimal left perirectal tenderness no induration no fluctuance and no erythema and no drainage.  Nontender calves  Lab Results: CBC   Recent Labs  04/18/15 0657 04/19/15 0528  WBC 9.2 7.4  HGB 13.9 14.0  HCT 43.1 43.7  PLT 278 282   BMET  Recent Labs  04/17/15 1043 04/18/15 0657  NA 138 136  K 3.8 3.8  CL 108 103  CO2 24 26  GLUCOSE 97 95  BUN 17 13  CREATININE 1.18 1.32*  CALCIUM 9.0 8.3*   PT/INR No results for input(s): LABPROT, INR in the last 72 hours. ABG No results for input(s): PHART, HCO3 in the last 72 hours.  Invalid input(s): PCO2, PO2  Studies/Results: No results found.  Anti-infectives: Anti-infectives    Start     Dose/Rate Route Frequency Ordered Stop   04/17/15 1800  cefOXitin (MEFOXIN) 1 g in dextrose 5 % 50 mL IVPB     1 g 100 mL/hr over 30 Minutes Intravenous 4 times per day 04/17/15 1731     04/17/15 1645  vancomycin (VANCOCIN) IVPB 1000 mg/200 mL premix     1,000 mg 200 mL/hr over 60 Minutes Intravenous  Once 04/17/15 1636 04/17/15 1833   04/17/15 1545   piperacillin-tazobactam (ZOSYN) IVPB 3.375 g     3.375 g 12.5 mL/hr over 240 Minutes Intravenous  Once 04/17/15 1540 04/17/15 1700   04/17/15 1545  vancomycin (VANCOCIN) injection 1 g  Status:  Discontinued     1 g Intravenous  Once 04/17/15 1540 04/17/15 1635      Assessment/Plan:  This patient's been treated now for nearly 48 hours with IV antibiotics. His constitutional symptoms seem to have improved however he remains with pain which is similar to his prior perirectal abscesses. With his failure to progress my recommendations would be to perform an examination under anesthesia and possible incision and drainage of a left-sided perirectal abscess. This is my high suspicion that he has a recurrence.  I will review his CT scan that was performed at a prior admission. I have discussed this case with Dr. Rexene Edison to perform surgery on him in December. He stated that the abscess was quite high and possibly supralevator.  I discussed these findings with the patient. No family was present. He agrees with this plan for EUA as well as for possible incision and drainage. the options were discussed. The risks of bleeding infection recurrence and open wound were all discussed with him. He understood and agreed to proceed.  Florene Glen, MD, FACS  04/19/2015

## 2015-04-19 NOTE — Progress Notes (Signed)
Pt c/o LLE becoming numb and notified Dr.Ely and he arrived to see the pt. Putting in orders.

## 2015-04-19 NOTE — OR Nursing (Signed)
Dressing : abd pads X2 , mesh panties

## 2015-04-19 NOTE — Op Note (Signed)
   Pre-operative Diagnosis: Perirectal abscess with recurrence  Post-operative Diagnosis: Same  Surgeon: Florene Glen   Assistants: Darcus Pester PAS  Anesthesia: Gen. with endotracheal tube    Surgeon: Jerrol Banana. Burt Knack, MD FACS    Procedure Details  The patient was seen again in the Holding Room. The benefits, complications, treatment options, and expected outcomes were discussed with the patient. The risks of bleeding, infection, recurrence of symptoms, failure to resolve symptoms,  bowel injury, any of which could require further surgery were reviewed with the patient.   The patient was taken to Operating Room, identified as Ronnie Smith and the procedure verified.  A Time Out was held and the above information confirmed.  Prior to the induction of general anesthesia, antibiotic prophylaxis was administered. VTE prophylaxis was in place. General endotracheal anesthesia was then administered and tolerated well. The patient was placed into a well-padded high lithotomy position. He was then prepped and draped in a sterile fashion.  A rectal exam was performed which demonstrated no masses but the patient of induration on the left side anal rectal wall. Palpation around the area of the prior scar where the patient had pointed to his maximum tenderness was identified and incision somewhat posterior to that was made with a blade. A clamp was placed into a small cavity of bloody fluid which was cultured. The Yankauer suction tip was placed through the incision and it entered into a cavity somewhat anterior and quite deep. More purulence exuded. While utilizing the anchor our suction tip it was noted that there was a second cavity that was somewhat more superficial and just beneath the prior scar. An incision was made nearest the scar in that cavity was opened as well and more purulence exuded this cavity tracked anterior towards the prostate. Once the brought the purulence had been aspirated,  2 Penrose drains were placed through the 2 incisions one tracking somewhat anteriorly and the other deep and anterior. These were sutured in with 3-0 nylon. There was no foreign sign of further purulence and no other evidence of additional abscess cavities.  A sterile dressing and mesh panties were placed.  The patient was taken to the recovery room in stable condition. The sponge lap needle count was correct. The estimated blood loss was 75 cc.  Findings: As above   Estimated Blood Loss: 75 cc         Drains: Penrose 2         Specimens: Cultures        Complications: None                  Condition: Stable  Dhana Totton E. Burt Knack, MD, FACS

## 2015-04-19 NOTE — OR Nursing (Signed)
E. Ventura Bruns PAS

## 2015-04-19 NOTE — Progress Notes (Signed)
Called to see patient with complaints of difficulty moving his left lower extremity. He had no significant problems immediately postoperatively. He said some significant perianal pain and over the last hour or so [place] a difficulty using the left leg. By nursing report he was able to assist interning himself with that leg since that time feels as though the leg will not move as he would like. He complains of some mild numbness around the knee and the upper thigh has no significant sensory changes by complain in his foot. He's never had a similar problem.  He seems very upset and anxious and in pain. The left leg is warm with good distal pulses no tenderness and good function in his foot. He will not move his knee or his hip spontaneously. He has no complaints with his right leg moves it very gingerly and with complaints of perianal pain.  I suspect it is possible he has a nerve injury from positioning in lithotomy but I anticipate the procedure was quite short and I do not see any indication of significant nerve dysfunction. There is not in any acute therapy required at the present time. We will see how his symptoms progress. I will consider giving him a single dose of steroids to reduce any swelling around the major left lower extremity nerve system. He is in agreement with this plan.

## 2015-04-20 NOTE — Progress Notes (Signed)
1 Day Post-Op  Subjective:  Patient is postop day 1 following I&D of a perirectal abscess  Last night early this morning Dr. Pat Patrick saw the patient for leg weakness on the left. Patient states that that is completely resolved and Dr. Pat Patrick states that he found no focal abnormality. Patient also describes much improved buttock pain and is feeling much better.   Objective: Vital signs in last 24 hours: Temp:  [98.2 F (36.8 C)-100.4 F (38 C)] 100.4 F (38 C) (05/13 0045) Pulse Rate:  [65-119] 115 (05/13 0045) Resp:  [13-46] 20 (05/13 0045) BP: (104-137)/(48-94) 104/48 mmHg (05/13 0045) SpO2:  [99 %-100 %] 99 % (05/13 0045) Weight:  [148.78 kg (328 lb)] 148.78 kg (328 lb) (05/12 1233)    Intake/Output from previous day: 05/12 0701 - 05/13 0700 In: 1873 [P.O.:120; I.V.:1753] Out: 2750 [Urine:2750] Intake/Output this shift:    Wounds clean Penrose drains in place.  Left leg neuro exam completely normal.  Lab Results: CBC   Recent Labs  04/18/15 0657 04/19/15 0528  WBC 9.2 7.4  HGB 13.9 14.0  HCT 43.1 43.7  PLT 278 282   BMET  Recent Labs  04/17/15 1043 04/18/15 0657  NA 138 136  K 3.8 3.8  CL 108 103  CO2 24 26  GLUCOSE 97 95  BUN 17 13  CREATININE 1.18 1.32*  CALCIUM 9.0 8.3*   PT/INR No results for input(s): LABPROT, INR in the last 72 hours. ABG No results for input(s): PHART, HCO3 in the last 72 hours.  Invalid input(s): PCO2, PO2  Studies/Results: No results found.  Anti-infectives: Anti-infectives    Start     Dose/Rate Route Frequency Ordered Stop   04/19/15 1215  cefOXitin (MEFOXIN) 1 g in dextrose 5 % 50 mL IVPB     1 g 100 mL/hr over 30 Minutes Intravenous  Once 04/19/15 1211 04/19/15 1326   04/17/15 1800  cefOXitin (MEFOXIN) 1 g in dextrose 5 % 50 mL IVPB     1 g 100 mL/hr over 30 Minutes Intravenous 4 times per day 04/17/15 1731     04/17/15 1645  vancomycin (VANCOCIN) IVPB 1000 mg/200 mL premix     1,000 mg 200 mL/hr over 60 Minutes  Intravenous  Once 04/17/15 1636 04/17/15 1833   04/17/15 1545  piperacillin-tazobactam (ZOSYN) IVPB 3.375 g     3.375 g 12.5 mL/hr over 240 Minutes Intravenous  Once 04/17/15 1540 04/17/15 1700   04/17/15 1545  vancomycin (VANCOCIN) injection 1 g  Status:  Discontinued     1 g Intravenous  Once 04/17/15 1540 04/17/15 1635      Assessment/Plan: s/p Procedure(s): IRRIGATION AND DEBRIDEMENT PERIRECTAL ABSCESS   Patient has improved considerably. He has still had a low-grade temperature and I would recommend continuing IV antibiotics for now. I will advance diet and saline lock. Probable discharge tomorrow on oral antibiotics.  I discussed with he and his mother both the findings and the fact that this is recurred 3 times  dictates that this is an unusual situation. With that in mind I have suggested that he see a colorectal surgery service A there at [facility] or [facility] is his preference, and that will be arranged as an outpatient as well.  Florene Glen, MD, FACS  04/20/2015

## 2015-04-21 LAB — CBC WITH DIFFERENTIAL/PLATELET
Basophils Absolute: 0.1 10*3/uL (ref 0–0.1)
Basophils Relative: 1 %
EOS PCT: 0 %
Eosinophils Absolute: 0 10*3/uL (ref 0–0.7)
HCT: 42.7 % (ref 40.0–52.0)
Hemoglobin: 14.2 g/dL (ref 13.0–18.0)
Lymphocytes Relative: 22 %
Lymphs Abs: 2 10*3/uL (ref 1.0–3.6)
MCH: 27.8 pg (ref 26.0–34.0)
MCHC: 33.2 g/dL (ref 32.0–36.0)
MCV: 83.6 fL (ref 80.0–100.0)
Monocytes Absolute: 1.1 10*3/uL — ABNORMAL HIGH (ref 0.2–1.0)
Monocytes Relative: 12 %
NEUTROS ABS: 6.1 10*3/uL (ref 1.4–6.5)
NEUTROS PCT: 65 %
PLATELETS: 338 10*3/uL (ref 150–440)
RBC: 5.11 MIL/uL (ref 4.40–5.90)
RDW: 13.4 % (ref 11.5–14.5)
WBC: 9.3 10*3/uL (ref 3.8–10.6)

## 2015-04-21 LAB — URINALYSIS COMPLETE WITH MICROSCOPIC (ARMC ONLY)
BACTERIA UA: NONE SEEN
Bilirubin Urine: NEGATIVE
Glucose, UA: NEGATIVE mg/dL
HGB URINE DIPSTICK: NEGATIVE
Leukocytes, UA: NEGATIVE
NITRITE: NEGATIVE
Protein, ur: NEGATIVE mg/dL
Specific Gravity, Urine: 1.014 (ref 1.005–1.030)
pH: 7 (ref 5.0–8.0)

## 2015-04-21 NOTE — Progress Notes (Signed)
Patient's temperature was elevated; tylenol given times two with an effective temp of 100.5.  Temperature ranged from 103.2 to 101; 102.3 to 100.5.

## 2015-04-21 NOTE — Progress Notes (Signed)
2 Days Post-Op  Subjective: Postop I&D of perirectal abscess. Patient states that he feels much better his pain is resolving. Of note the patient has had relatively high fevers last night .  Objective: Vital signs in last 24 hours: Temp:  [98 F (36.7 C)-103.2 F (39.6 C)] 100.5 F (38.1 C) (05/14 0637) Pulse Rate:  [84-102] 101 (05/14 0006) Resp:  [16-18] 16 (05/14 0006) BP: (112-120)/(55-67) 116/63 mmHg (05/14 0006) SpO2:  [98 %-100 %] 98 % (05/14 0006) Last BM Date: 04/16/15  Intake/Output from previous day: 05/13 0701 - 05/14 0700 In: 1250 [P.O.:1200; IV Piggyback:50] Out: 6283 [Urine:1425] Intake/Output this shift:    Physical exam:  Penrose drains in place minimal drainage noted. Minimal tenderness no erythema.  Lab Results: CBC   Recent Labs  04/19/15 0528 04/21/15 0506  WBC 7.4 9.3  HGB 14.0 14.2  HCT 43.7 42.7  PLT 282 338   BMET No results for input(s): NA, K, CL, CO2, GLUCOSE, BUN, CREATININE, CALCIUM in the last 72 hours. PT/INR No results for input(s): LABPROT, INR in the last 72 hours. ABG No results for input(s): PHART, HCO3 in the last 72 hours.  Invalid input(s): PCO2, PO2  Studies/Results: No results found.  Anti-infectives: Anti-infectives    Start     Dose/Rate Route Frequency Ordered Stop   04/19/15 1215  cefOXitin (MEFOXIN) 1 g in dextrose 5 % 50 mL IVPB     1 g 100 mL/hr over 30 Minutes Intravenous  Once 04/19/15 1211 04/19/15 1326   04/17/15 1800  cefOXitin (MEFOXIN) 1 g in dextrose 5 % 50 mL IVPB     1 g 100 mL/hr over 30 Minutes Intravenous 4 times per day 04/17/15 1731     04/17/15 1645  vancomycin (VANCOCIN) IVPB 1000 mg/200 mL premix     1,000 mg 200 mL/hr over 60 Minutes Intravenous  Once 04/17/15 1636 04/17/15 1833   04/17/15 1545  piperacillin-tazobactam (ZOSYN) IVPB 3.375 g     3.375 g 12.5 mL/hr over 240 Minutes Intravenous  Once 04/17/15 1540 04/17/15 1700   04/17/15 1545  vancomycin (VANCOCIN) injection 1 g   Status:  Discontinued     1 g Intravenous  Once 04/17/15 1540 04/17/15 1635      Assessment/Plan: s/p Procedure(s): IRRIGATION AND DEBRIDEMENT PERIRECTAL ABSCESS   Patient is doing fairly well postoperatively but has had a high fever. I am reluctant to switch him to oral antibiotics at this point with the high fever. I will get a UA today and continue the Penrose drains. Patient is tolerating a regular diet and feels much better over his preop condition. The etiology of his fevers is not clear to me at this point. We will reexamine.Florene Glen, MD, FACS  04/21/2015

## 2015-04-22 ENCOUNTER — Encounter: Payer: Self-pay | Admitting: Surgery

## 2015-04-22 LAB — CULTURE, BLOOD (ROUTINE X 2)
CULTURE: NO GROWTH
Culture: NO GROWTH

## 2015-04-22 LAB — CBC WITH DIFFERENTIAL/PLATELET
Basophils Absolute: 0.2 10*3/uL — ABNORMAL HIGH (ref 0–0.1)
Basophils Relative: 1 %
EOS ABS: 0 10*3/uL (ref 0–0.7)
Eosinophils Relative: 0 %
HEMATOCRIT: 43.4 % (ref 40.0–52.0)
HEMOGLOBIN: 14.3 g/dL (ref 13.0–18.0)
Lymphocytes Relative: 19 %
Lymphs Abs: 2.5 10*3/uL (ref 1.0–3.6)
MCH: 27.7 pg (ref 26.0–34.0)
MCHC: 33.1 g/dL (ref 32.0–36.0)
MCV: 83.6 fL (ref 80.0–100.0)
Monocytes Absolute: 1.7 10*3/uL — ABNORMAL HIGH (ref 0.2–1.0)
Monocytes Relative: 13 %
NEUTROS ABS: 8.4 10*3/uL — AB (ref 1.4–6.5)
NEUTROS PCT: 67 %
PLATELETS: 332 10*3/uL (ref 150–440)
RBC: 5.19 MIL/uL (ref 4.40–5.90)
RDW: 13.2 % (ref 11.5–14.5)
WBC: 12.7 10*3/uL — AB (ref 3.8–10.6)

## 2015-04-22 MED ORDER — OXYCODONE-ACETAMINOPHEN 5-325 MG PO TABS: 2.0000 | ORAL_TABLET | ORAL | Status: DC | PRN

## 2015-04-22 MED ORDER — METRONIDAZOLE 500 MG PO TABS: 500.0000 mg | ORAL_TABLET | Freq: Three times a day (TID) | ORAL | Status: DC

## 2015-04-22 MED ORDER — CEPHALEXIN 500 MG PO CAPS: 500.0000 mg | ORAL_CAPSULE | Freq: Four times a day (QID) | ORAL | Status: DC

## 2015-04-22 MED ORDER — OXYCODONE-ACETAMINOPHEN 5-325 MG PO TABS: 1.0000 | ORAL_TABLET | ORAL | Status: DC | PRN

## 2015-04-22 NOTE — Progress Notes (Signed)
3 Days Post-Op  Subjective: Postop from I&D of perirectal abscess. Patient states pain is almost gone. He has not had a fever in 24 hours. He is tolerating a regular diet.  Objective: Vital signs in last 24 hours: Temp:  [98 F (36.7 C)-99.7 F (37.6 C)] 99.3 F (37.4 C) (05/15 0746) Pulse Rate:  [95-108] 106 (05/15 0746) Resp:  [18-20] 18 (05/15 0746) BP: (105-129)/(48-72) 129/65 mmHg (05/15 0746) SpO2:  [94 %-99 %] 99 % (05/15 0746) Last BM Date: 04/16/15  Intake/Output from previous day: 05/14 0701 - 05/15 0700 In: 870 [P.O.:720; IV Piggyback:150] Out: 2325 [Urine:2325] Intake/Output this shift:    Physical exam:  Mildly obese Penrose drains in place nontender  Lab Results: CBC   Recent Labs  04/21/15 0506 04/22/15 0427  WBC 9.3 12.7*  HGB 14.2 14.3  HCT 42.7 43.4  PLT 338 332   BMET No results for input(s): NA, K, CL, CO2, GLUCOSE, BUN, CREATININE, CALCIUM in the last 72 hours. PT/INR No results for input(s): LABPROT, INR in the last 72 hours. ABG No results for input(s): PHART, HCO3 in the last 72 hours.  Invalid input(s): PCO2, PO2  Studies/Results: No results found.  Anti-infectives: Anti-infectives    Start     Dose/Rate Route Frequency Ordered Stop   04/19/15 1215  cefOXitin (MEFOXIN) 1 g in dextrose 5 % 50 mL IVPB     1 g 100 mL/hr over 30 Minutes Intravenous  Once 04/19/15 1211 04/19/15 1326   04/17/15 1800  cefOXitin (MEFOXIN) 1 g in dextrose 5 % 50 mL IVPB     1 g 100 mL/hr over 30 Minutes Intravenous 4 times per day 04/17/15 1731     04/17/15 1645  vancomycin (VANCOCIN) IVPB 1000 mg/200 mL premix     1,000 mg 200 mL/hr over 60 Minutes Intravenous  Once 04/17/15 1636 04/17/15 1833   04/17/15 1545  piperacillin-tazobactam (ZOSYN) IVPB 3.375 g     3.375 g 12.5 mL/hr over 240 Minutes Intravenous  Once 04/17/15 1540 04/17/15 1700   04/17/15 1545  vancomycin (VANCOCIN) injection 1 g  Status:  Discontinued     1 g Intravenous  Once 04/17/15  1540 04/17/15 1635      Assessment/Plan: s/p Procedure(s): IRRIGATION AND DEBRIDEMENT PERIRECTAL ABSCESS   Much improved. Afebrile 24 hours would like to continue IV antibiotics 1 more day and discharged tomorrow on by mouth antibiotics with Penrose drains in place.Florene Glen, MD, FACS  04/22/2015

## 2015-04-22 NOTE — Discharge Summary (Signed)
Physician Discharge Summary  Patient ID: Ronnie Smith MRN: [id number] DOB/AGE: [birth year] 19 y.o.  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 He was taken to the operating room where incision and drainage was performed 2 separate cavities were identified. Penrose drains were placed and are still in place today. A dry dressing is being utilized. Over the course of the hospitalization his pain improved considerably and in fact is nearly gone at this point. Patient had some elevated fevers during the hospitalization and was treated with IV antibiotics. He is being transitioned to oral antibiotics and given oral analgesics as well. He is instructed to continue a regular diet to shower and to replace a dry dressing periodically. He will follow-up in my office in 3-4 days for wound check and possible Penrose drain removal.  I've discussed with the patient the recurrence risk here and the fact that it has recurred 3 times and is in an unusual anterior location. This should dictate the need for further workup with a board certified colorectal surgeon and the patient prefers to go to [facility] for that. This will be arranged as an outpatient  Consults: None  Significant Diagnostic Studies: None   Disposition: Final discharge disposition not confirmed     Medication List    TAKE these medications        cephALEXin 500 MG capsule  Commonly known as:  KEFLEX  Take 1 capsule (500 mg total) by mouth 4 (four) times daily.     metroNIDAZOLE 500 MG tablet  Commonly known as:  FLAGYL  Take 1 tablet (500 mg total) by mouth 3 (three) times daily.     oxyCODONE-acetaminophen 5-325 MG per  tablet  Commonly known as:  PERCOCET/ROXICET  Take 1 tablet by mouth every 4 (four) hours as needed for moderate pain.     oxyCODONE-acetaminophen 5-325 MG per tablet  Commonly known as:  ROXICET  Take 2 tablets by mouth every 4 (four) hours as needed for moderate pain or severe pain.           Follow-up Information    Schedule an appointment as soon as possible for a visit with Florene Glen, MD.   Specialties:  Surgery, Radiology   Why:  For wound re-check   Contact information:   [address] [place] [id number] [phone]       Florene Glen, MD, FACS

## 2015-04-22 NOTE — Discharge Instructions (Signed)
Regular diet  Fill and take prescriptions as directed.  Shower as needed  Dry dressing over drains and wounds as needed   follow-up with Dr. Burt Knack Wednesday or Thursday

## 2015-04-23 NOTE — Progress Notes (Signed)
Patient ID: Ronnie Smith, male   DOB: Jun 29, 1996, 19 y.o.   MRN: [id number]   The patient is doing well ambulatory in the hall is afebrile vital signs are stable Penrose drain is in good position. Stable and improved for discharge follow-up in our office next week.

## 2015-04-23 NOTE — Progress Notes (Signed)
Discharge  Pt was given discharge instructions with family at bedside. Pt was instructed that prescriptions had been called in to his pharmacy and needed to be picked up after discharge. Pt having no complaints of pain at this time and request a wheelchair to get to front door.

## 2015-05-10 LAB — WOUND CULTURE: SPECIAL REQUESTS: NORMAL

## 2015-05-10 LAB — ANAEROBIC CULTURE

## 2015-05-23 NOTE — Addendum Note (Signed)
Addendum  created 05/23/15 1547 by Alvin Critchley, MD   Modules edited: Anesthesia Attestations, Anesthesia Events, Narrator   Narrator:  Narrator: Event Log Edited

## 2015-07-19 DIAGNOSIS — K610 Anal abscess: Secondary | ICD-10-CM

## 2015-07-19 HISTORY — DX: Anal abscess: K61.0

## 2015-09-23 ENCOUNTER — Encounter: Payer: Self-pay | Admitting: Emergency Medicine

## 2015-09-23 ENCOUNTER — Emergency Department
Admission: EM | Admit: 2015-09-23 | Discharge: 2015-09-23 | Disposition: A | Discharge status: Home / Self Care | Payer: BC Managed Care – PPO | Attending: Emergency Medicine | Admitting: Emergency Medicine

## 2015-09-23 DIAGNOSIS — R112 Nausea with vomiting, unspecified: Secondary | ICD-10-CM | POA: Diagnosis present

## 2015-09-23 DIAGNOSIS — B349 Viral infection, unspecified: Secondary | ICD-10-CM | POA: Insufficient documentation

## 2015-09-23 DIAGNOSIS — J039 Acute tonsillitis, unspecified: Secondary | ICD-10-CM | POA: Diagnosis not present

## 2015-09-23 DIAGNOSIS — R000 Tachycardia, unspecified: Secondary | ICD-10-CM | POA: Diagnosis not present

## 2015-09-23 DIAGNOSIS — J029 Acute pharyngitis, unspecified: Secondary | ICD-10-CM

## 2015-09-23 HISTORY — DX: Noninfective gastroenteritis and colitis, unspecified: K52.9

## 2015-09-23 LAB — COMPREHENSIVE METABOLIC PANEL
ALT: 48 U/L (ref 17–63)
AST: 25 U/L (ref 15–41)
Albumin: 4.7 g/dL (ref 3.5–5.0)
Alkaline Phosphatase: 90 U/L (ref 38–126)
Anion gap: 9 (ref 5–15)
BUN: 11 mg/dL (ref 6–20)
CALCIUM: 9.3 mg/dL (ref 8.9–10.3)
CO2: 24 mmol/L (ref 22–32)
CREATININE: 1.11 mg/dL (ref 0.61–1.24)
Chloride: 106 mmol/L (ref 101–111)
GFR calc Af Amer: >60 mL/min (ref >60)
Glucose, Bld: 98 mg/dL (ref 65–99)
POTASSIUM: 3.6 mmol/L (ref 3.5–5.1)
SODIUM: 139 mmol/L (ref 135–145)
TOTAL PROTEIN: 8.1 g/dL (ref 6.5–8.1)
Total Bilirubin: 0.6 mg/dL (ref 0.3–1.2)

## 2015-09-23 LAB — URINALYSIS COMPLETE WITH MICROSCOPIC (ARMC ONLY)
BACTERIA UA: NONE SEEN
BILIRUBIN URINE: NEGATIVE
GLUCOSE, UA: NEGATIVE mg/dL
Hgb urine dipstick: NEGATIVE
Ketones, ur: NEGATIVE mg/dL
Leukocytes, UA: NEGATIVE
Nitrite: NEGATIVE
Protein, ur: NEGATIVE mg/dL
Specific Gravity, Urine: 1.024 (ref 1.005–1.030)
pH: 5 (ref 5.0–8.0)

## 2015-09-23 LAB — CBC WITH DIFFERENTIAL/PLATELET
BASOS ABS: 0.1 10*3/uL (ref 0–0.1)
Basophils Relative: 0 %
EOS ABS: 0.1 10*3/uL (ref 0–0.7)
EOS PCT: 1 %
HCT: 46.9 % (ref 40.0–52.0)
Hemoglobin: 15.7 g/dL (ref 13.0–18.0)
Lymphocytes Relative: 40 %
Lymphs Abs: 5.5 10*3/uL — ABNORMAL HIGH (ref 1.0–3.6)
MCH: 27.5 pg (ref 26.0–34.0)
MCHC: 33.5 g/dL (ref 32.0–36.0)
MCV: 81.9 fL (ref 80.0–100.0)
MONO ABS: 1.4 10*3/uL — AB (ref 0.2–1.0)
Monocytes Relative: 10 %
Neutro Abs: 6.7 10*3/uL — ABNORMAL HIGH (ref 1.4–6.5)
Neutrophils Relative %: 49 %
PLATELETS: 343 10*3/uL (ref 150–440)
RBC: 5.73 MIL/uL (ref 4.40–5.90)
RDW: 14.1 % (ref 11.5–14.5)
WBC: 13.8 10*3/uL — AB (ref 3.8–10.6)

## 2015-09-23 LAB — LIPASE, BLOOD: LIPASE: 17 U/L — AB (ref 22–51)

## 2015-09-23 LAB — POCT RAPID STREP A: STREPTOCOCCUS, GROUP A SCREEN (DIRECT): NEGATIVE

## 2015-09-23 MED ORDER — SODIUM CHLORIDE 0.9 % IV SOLN
1000.0000 mL | Freq: Once | INTRAVENOUS | Status: AC
  Administered 2015-09-23: 1000 mL via INTRAVENOUS

## 2015-09-23 MED ORDER — ONDANSETRON HCL 4 MG/2ML IJ SOLN
4.0000 mg | Freq: Once | INTRAMUSCULAR | Status: AC
  Administered 2015-09-23: 4 mg via INTRAVENOUS
  Filled 2015-09-23: qty 2

## 2015-09-23 MED ORDER — DEXAMETHASONE SODIUM PHOSPHATE 10 MG/ML IJ SOLN
10.0000 mg | Freq: Once | INTRAMUSCULAR | Status: AC
  Administered 2015-09-23: 10 mg via INTRAVENOUS
  Filled 2015-09-23: qty 1

## 2015-09-23 MED ORDER — ONDANSETRON HCL 4 MG PO TABS: ORAL_TABLET | ORAL | Status: DC

## 2015-09-23 MED ORDER — CEPHALEXIN 500 MG PO CAPS
500.0000 mg | ORAL_CAPSULE | Freq: Once | ORAL | Status: AC
  Administered 2015-09-23: 500 mg via ORAL
  Filled 2015-09-23: qty 1

## 2015-09-23 MED ORDER — CEPHALEXIN 500 MG PO CAPS: 500.0000 mg | ORAL_CAPSULE | Freq: Two times a day (BID) | ORAL | Status: DC

## 2015-09-23 NOTE — Discharge Instructions (Signed)
As we discussed, you may have a viral infection that is causing your symptoms, but given the obvious infection of your tonsils and the back of your throat, we are treating you with antibiotics.  We also gave you a dose of steroids to help with the inflammation.  Please drink plenty of fluids, take the FULL COURSE (10 days) of antibiotics as prescribed, use Tylenol and/or ibuprofen as needed for pain and fever, and use the prescribed nausea medication as needed.  We recommend that you follow-up with Dr. Pryor Ochoa or one of his partners within several days (the next available appointment) for a follow-up appointment and to discuss with them the recurrent problems you have had with your tonsils.  If you develop new or worsening symptoms that concern you, or if you feel that getting worse and not better, please return immediately to the emergency department for further evaluation.   Tonsillitis Tonsillitis is an infection of the throat that causes the tonsils to become red, tender, and swollen. Tonsils are collections of lymphoid tissue at the back of the throat. Each tonsil has crevices (crypts). Tonsils help fight nose and throat infections and keep infection from spreading to other parts of the body for the first 18 months of life.  CAUSES Sudden (acute) tonsillitis is usually caused by infection with streptococcal bacteria. Long-lasting (chronic) tonsillitis occurs when the crypts of the tonsils become filled with pieces of food and bacteria, which makes it easy for the tonsils to become repeatedly infected. SYMPTOMS  Symptoms of tonsillitis include:  A sore throat, with possible difficulty swallowing.  White patches on the tonsils.  Fever.  Tiredness.  New episodes of snoring during sleep, when you did not snore before.  Small, foul-smelling, yellowish-white pieces of material (tonsilloliths) that you occasionally cough up or spit out. The tonsilloliths can also cause you to have bad  breath. DIAGNOSIS Tonsillitis can be diagnosed through a physical exam. Diagnosis can be confirmed with the results of lab tests, including a throat culture. TREATMENT  The goals of tonsillitis treatment include the reduction of the severity and duration of symptoms and prevention of associated conditions. Symptoms of tonsillitis can be improved with the use of steroids to reduce the swelling. Tonsillitis caused by bacteria can be treated with antibiotic medicines. Usually, treatment with antibiotic medicines is started before the cause of the tonsillitis is known. However, if it is determined that the cause is not bacterial, antibiotic medicines will not treat the tonsillitis. If attacks of tonsillitis are severe and frequent, your health care provider may recommend surgery to remove the tonsils (tonsillectomy). HOME CARE INSTRUCTIONS   Rest as much as possible and get plenty of sleep.  Drink plenty of fluids. While the throat is very sore, eat soft foods or liquids, such as sherbet, soups, or instant breakfast drinks.  Eat frozen ice pops.  Gargle with a warm or cold liquid to help soothe the throat. Mix 1/4 teaspoon of salt and 1/4 teaspoon of baking soda in 8 oz of water. SEEK MEDICAL CARE IF:   Large, tender lumps develop in your neck.  A rash develops.  A green, yellow-brown, or bloody substance is coughed up.  You are unable to swallow liquids or food for 24 hours.  You notice that only one of the tonsils is swollen. SEEK IMMEDIATE MEDICAL CARE IF:   You develop any new symptoms such as vomiting, severe headache, stiff neck, chest pain, or trouble breathing or swallowing.  You have severe throat pain along with  drooling or voice changes.  You have severe pain, unrelieved with recommended medications.  You are unable to fully open the mouth.  You develop redness, swelling, or severe pain anywhere in the neck.  You have a fever. MAKE SURE YOU:   Understand these  instructions.  Will watch your condition.  Will get help right away if you are not doing well or get worse.   This information is not intended to replace advice given to you by your health care provider. Make sure you discuss any questions you have with your health care provider.   Document Released: 09/03/2005 Document Revised: 12/15/2014 Document Reviewed: 05/13/2013 Elsevier Interactive Patient Education 2016 Elsevier [employer]  Pharyngitis Pharyngitis is redness, pain, and swelling (inflammation) of your pharynx.  CAUSES  Pharyngitis is usually caused by infection. Most of the time, these infections are from viruses (viral) and are part of a cold. However, sometimes pharyngitis is caused by bacteria (bacterial). Pharyngitis can also be caused by allergies. Viral pharyngitis may be spread from person to person by coughing, sneezing, and personal items or utensils (cups, forks, spoons, toothbrushes). Bacterial pharyngitis may be spread from person to person by more intimate contact, such as kissing.  SIGNS AND SYMPTOMS  Symptoms of pharyngitis include:   Sore throat.   Tiredness (fatigue).   Low-grade fever.   Headache.  Joint pain and muscle aches.  Skin rashes.  Swollen lymph nodes.  Plaque-like film on throat or tonsils (often seen with bacterial pharyngitis). DIAGNOSIS  Your health care provider will ask you questions about your illness and your symptoms. Your medical history, along with a physical exam, is often all that is needed to diagnose pharyngitis. Sometimes, a rapid strep test is done. Other lab tests may also be done, depending on the suspected cause.  TREATMENT  Viral pharyngitis will usually get better in 3-4 days without the use of medicine. Bacterial pharyngitis is treated with medicines that kill germs (antibiotics).  HOME CARE INSTRUCTIONS   Drink enough water and fluids to keep your urine clear or pale yellow.   Only take over-the-counter or  prescription medicines as directed by your health care provider:   If you are prescribed antibiotics, make sure you finish them even if you start to feel better.   Do not take aspirin.   Get lots of rest.   Gargle with 8 oz of salt water ( tsp of salt per 1 qt of water) as often as every 1-2 hours to soothe your throat.   Throat lozenges (if you are not at risk for choking) or sprays may be used to soothe your throat. SEEK MEDICAL CARE IF:   You have large, tender lumps in your neck.  You have a rash.  You cough up green, yellow-brown, or bloody spit. SEEK IMMEDIATE MEDICAL CARE IF:   Your neck becomes stiff.  You drool or are unable to swallow liquids.  You vomit or are unable to keep medicines or liquids down.  You have severe pain that does not go away with the use of recommended medicines.  You have trouble breathing (not caused by a stuffy nose). MAKE SURE YOU:   Understand these instructions.  Will watch your condition.  Will get help right away if you are not doing well or get worse.   This information is not intended to replace advice given to you by your health care provider. Make sure you discuss any questions you have with your health care provider.   Document Released: 11/24/2005  Document Revised: 09/14/2013 Document Reviewed: 08/01/2013 Elsevier Interactive Patient Education 2016 Elsevier Inc.   Viral Infections A viral infection can be caused by different types of viruses.Most viral infections are not serious and resolve on their own. However, some infections may cause severe symptoms and may lead to further complications. SYMPTOMS Viruses can frequently cause:  Minor sore throat.  Aches and pains.  Headaches.  Runny nose.  Different types of rashes.  Watery eyes.  Tiredness.  Cough.  Loss of appetite.  Gastrointestinal infections, resulting in nausea, vomiting, and diarrhea. These symptoms do not respond to antibiotics because  the infection is not caused by bacteria. However, you might catch a bacterial infection following the viral infection. This is sometimes called a "superinfection." Symptoms of such a bacterial infection may include:  Worsening sore throat with pus and difficulty swallowing.  Swollen neck glands.  Chills and a high or persistent fever.  Severe headache.  Tenderness over the sinuses.  Persistent overall ill feeling (malaise), muscle aches, and tiredness (fatigue).  Persistent cough.  Yellow, green, or brown mucus production with coughing. HOME CARE INSTRUCTIONS   Only take over-the-counter or prescription medicines for pain, discomfort, diarrhea, or fever as directed by your caregiver.  Drink enough water and fluids to keep your urine clear or pale yellow. Sports drinks can provide valuable electrolytes, sugars, and hydration.  Get plenty of rest and maintain proper nutrition. Soups and broths with crackers or rice are fine. SEEK IMMEDIATE MEDICAL CARE IF:   You have severe headaches, shortness of breath, chest pain, neck pain, or an unusual rash.  You have uncontrolled vomiting, diarrhea, or you are unable to keep down fluids.  You or your child has an oral temperature above 102 F (38.9 C), not controlled by medicine.  Your baby is older than 3 months with a rectal temperature of 102 F (38.9 C) or higher.  Your baby is 5 months old or younger with a rectal temperature of 100.4 F (38 C) or higher. MAKE SURE YOU:   Understand these instructions.  Will watch your condition.  Will get help right away if you are not doing well or get worse.   This information is not intended to replace advice given to you by your health care provider. Make sure you discuss any questions you have with your health care provider.   Document Released: 09/03/2005 Document Revised: 02/16/2012 Document Reviewed: 05/02/2015 Elsevier Interactive Patient Education [employer].

## 2015-09-23 NOTE — ED Notes (Signed)
Pt c/o sore throat, vomiting and headaches for 3 days; last vomited at 2am; talking in complete coherent sentences

## 2015-09-23 NOTE — ED Notes (Signed)
MD at bedside. 

## 2015-09-23 NOTE — ED Provider Notes (Signed)
[facility] Emergency Department Provider Note  ____________________________________________  Time seen: Approximately 6:48 AM  I have reviewed the triage vital signs and the nursing notes.   HISTORY  Chief Complaint Sore Throat; Emesis; Headache; Diarrhea; and Abdominal Pain    HPI Ronnie Smith is a 19 y.o. male with history of tonsillitis/pharyngitis approximately one year ago and recent issues with perirectal abscesses requiring surgical drainage who presents with about 4 days of gradual onset, slowly worsening sore throat and general viral symptoms.  He reports that it started with a sore throat and is considerably worse with some pain when he swallows but no difficulty tolerating secretions.  He has had a headache intermittently, several episodes of vomiting, general body aches.  He felt that he might have the flu and has been trying to take over-the-counter medications and drink fluids but given that his sore throat is worsening he thought he should come and get evaluated.  He reports mild subjective fever, and denies chest pain, shortness of breath, cough, abdominal pain, diarrhea.  Overall he describes the symptoms as severe at this time.  Nothing makes them better and nothing makes them worse.   Past Medical History  Diagnosis Date  . Colitis     Patient Active Problem List   Diagnosis Date Noted  . Perirectal abscess 04/17/2015    Past Surgical History  Procedure Laterality Date  . Incision and drainage perirectal abscess N/A 04/19/2015    Procedure: IRRIGATION AND DEBRIDEMENT PERIRECTAL ABSCESS;  Surgeon: Florene Glen, MD;  Location: [facility] ORS;  Service: General;  Laterality: N/A;    Current Outpatient Rx  Name  Route  Sig  Dispense  Refill  . cephALEXin (KEFLEX) 500 MG capsule   Oral   Take 1 capsule (500 mg total) by mouth 2 (two) times daily.   20 capsule   0   . ondansetron (ZOFRAN) 4 MG tablet      Take 1-2 tabs by mouth every  8 hours as needed for nausea/vomiting   30 tablet   0     Allergies Doxycycline  History reviewed. No pertinent family history.  Social History Social History  Substance Use Topics  . Smoking status: Never Smoker   . Smokeless tobacco: Never Used  . Alcohol Use: No    Review of Systems Constitutional: Subjective fever/chills.  Generalized body aches. Eyes: No visual changes. ENT: Sore throat and "swollen tonsils and " Cardiovascular: Denies chest pain. Respiratory: Denies shortness of breath. Gastrointestinal: No abdominal pain.  Several episodes of vomiting over the last couple of days with intermittent nausea.  No diarrhea.  No constipation. Genitourinary: Negative for dysuria. Musculoskeletal: Negative for back pain. Skin: Negative for rash. Neurological: Negative for headaches, focal weakness or numbness.  10-point ROS otherwise negative.  ____________________________________________   PHYSICAL EXAM:  VITAL SIGNS: ED Triage Vitals  Enc Vitals Group     BP 09/23/15 0558 138/88 mmHg     Pulse Rate 09/23/15 0558 103     Resp 09/23/15 0558 18     Temp 09/23/15 0558 99.3 F (37.4 C)     Temp Source 09/23/15 0558 Oral     SpO2 09/23/15 0558 99 %     Weight 09/23/15 0558 316 lb (143.337 kg)     Height 09/23/15 0558 6\' 1"  (1.854 m)     Head Cir --      Peak Flow --      Pain Score 09/23/15 0558 8  Pain Loc --      Pain Edu? --      Excl. in [place]? --     Constitutional: Alert and oriented. Well appearing and in no acute distress. Eyes: Conjunctivae are normal. PERRL. EOMI. Head: Atraumatic. Nose: No congestion/rhinnorhea. Mouth/Throat: Mucous membranes are moist.  Posterior pharynx is erythematous and tonsils are significantly enlarged bilaterally, cryptic, and with significant amount of exudate on both tonsils.  The uvula is not deviated and there is no evidence to suggest a peritonsillar abscess.  No submandibular induration or induration under the tongue  to suggest Ludwig's angina.  Handling secretions well. Neck: No stridor.  No meningismus. Hematological/Lymphatic/Immunilogical: No cervical lymphadenopathy.   Cardiovascular: Borderline tachycardia after 500 mils normal saline bolus, regular rhythm. Grossly normal heart sounds.  Good peripheral circulation. Respiratory: Normal respiratory effort.  No retractions. Lungs CTAB. Gastrointestinal: Soft and nontender. No distention. No abdominal bruits. No CVA tenderness. Musculoskeletal: No lower extremity tenderness nor edema.  No joint effusions. Neurologic:  Normal speech and language.  No muffled or "hot potato" voice.  No gross focal neurologic deficits are appreciated.  Skin:  Skin is warm, dry and intact. No rash noted. Psychiatric: Mood and affect are normal. Speech and behavior are normal.  ____________________________________________   LABS (all labs ordered are listed, but only abnormal results are displayed)  Labs Reviewed  CBC WITH DIFFERENTIAL/PLATELET - Abnormal; Notable for the following:    WBC 13.8 (*)    Neutro Abs 6.7 (*)    Lymphs Abs 5.5 (*)    Monocytes Absolute 1.4 (*)    All other components within normal limits  LIPASE, BLOOD - Abnormal; Notable for the following:    Lipase 17 (*)    All other components within normal limits  URINALYSIS COMPLETEWITH MICROSCOPIC ([facility] ONLY) - Abnormal; Notable for the following:    Color, Urine YELLOW (*)    APPearance CLEAR (*)    Squamous Epithelial / LPF 0-5 (*)    All other components within normal limits  CULTURE, GROUP A STREP ([facility] ONLY)  COMPREHENSIVE METABOLIC PANEL  POCT RAPID STREP A   ____________________________________________  EKG  Not indicated ____________________________________________  RADIOLOGY   Not indicated  ____________________________________________   PROCEDURES  Procedure(s) performed: None  Critical Care performed: No ____________________________________________   INITIAL  IMPRESSION / ASSESSMENT AND PLAN / ED COURSE  Pertinent labs & imaging results that were available during my care of the patient were reviewed by me and considered in my medical decision making (see chart for details).  The patient appears to have tonsillitis/pharyngitis.  This may be a viral, but given the appearance of his tonsils with significant swelling and a moderate amount of exudate, I will treat for bacterial causes well.  The patient's initial tachycardia in the 120s has come down to the 90s after 500 mL of normal saline; will continue the full liter bolus ordered previously.  Since it is generally well tolerated, I will provide Keflex 500 mg twice a day 10 days.  I am giving him a dose of Decadron 10 mg IV to help with the swelling given that his tonsils are so swollen they are nearly touching in the back of his throat.  I gave the patient my usual and customary management and return precautions, and I did encourage him to follow up with ENT within the next several days or at the next available opportunity.  I also gave him strict instructions to return to the emergency department if he develops new  or worsening symptoms.  ____________________________________________  FINAL CLINICAL IMPRESSION(S) / ED DIAGNOSES  Final diagnoses:  Viral syndrome  Tonsillopharyngitis      NEW MEDICATIONS STARTED DURING THIS VISIT:  New Prescriptions   CEPHALEXIN (KEFLEX) 500 MG CAPSULE    Take 1 capsule (500 mg total) by mouth 2 (two) times daily.   ONDANSETRON (ZOFRAN) 4 MG TABLET    Take 1-2 tabs by mouth every 8 hours as needed for nausea/vomiting     Hinda Kehr, MD 09/23/15 [phone]

## 2015-09-24 LAB — CULTURE, GROUP A STREP (THRC)

## 2015-09-27 ENCOUNTER — Inpatient Hospital Stay: Admission: RE | Admit: 2015-09-27 | Payer: BC Managed Care – PPO | Source: Ambulatory Visit

## 2015-09-27 ENCOUNTER — Encounter: Payer: Self-pay | Admitting: *Deleted

## 2015-09-27 NOTE — Patient Instructions (Signed)
  Your procedure is scheduled on: 10-02-15 Report to [place] To find out your arrival time please call [phone] between 1PM - 3PM on 10-01-15  Remember: Instructions that are not followed completely may result in serious medical risk, up to and including death, or upon the discretion of your surgeon and anesthesiologist your surgery may need to be rescheduled.    _X___ 1. Do not eat food or drink liquids after midnight. No gum chewing or hard candies.     _X___ 2. No Alcohol for 24 hours before or after surgery.   ____ 3. Bring all medications with you on the day of surgery if instructed.    ____ 4. Notify your doctor if there is any change in your medical condition     (cold, fever, infections).     Do not wear jewelry, make-up, hairpins, clips or nail polish.  Do not wear lotions, powders, or perfumes. You may wear deodorant.  Do not shave 48 hours prior to surgery. Men may shave face and neck.  Do not bring valuables to the hospital.    [facility] is not responsible for any belongings or valuables.               Contacts, dentures or bridgework may not be worn into surgery.  Leave your suitcase in the car. After surgery it may be brought to your room.  For patients admitted to the hospital, discharge time is determined by your treatment team.   Patients discharged the day of surgery will not be allowed to drive home.   Please read over the following fact sheets that you were given:      ____ Take these medicines the morning of surgery with A SIP OF WATER:    1. NONE  2.   3.   4.  5.  6.  ____ Fleet Enema (as directed)   ____ Use CHG Soap as directed  ____ Use inhalers on the day of surgery  ____ Stop metformin 2 days prior to surgery    ____ Take 1/2 of usual insulin dose the night before surgery and none on the morning of surgery.   ____ Stop Coumadin/Plavix/aspirin-N/A  ____ Stop Anti-inflammatories-NO NSAIDS OR ASA  PRODUCTS-TYLENOL OK   ____ Stop supplements until after surgery.    ____ Bring C-Pap to the hospital.

## 2015-10-02 ENCOUNTER — Encounter
Admission: AD | Disposition: A | Discharge status: Home / Self Care | Payer: Self-pay | Source: Ambulatory Visit | Attending: Unknown Physician Specialty

## 2015-10-02 ENCOUNTER — Ambulatory Visit: Payer: BC Managed Care – PPO | Admitting: Anesthesiology

## 2015-10-02 ENCOUNTER — Observation Stay
Admission: AD | Admit: 2015-10-02 | Discharge: 2015-10-03 | Disposition: A | Discharge status: Home / Self Care | Payer: BC Managed Care – PPO | Source: Ambulatory Visit | Attending: Unknown Physician Specialty | Admitting: Unknown Physician Specialty

## 2015-10-02 DIAGNOSIS — I10 Essential (primary) hypertension: Secondary | ICD-10-CM | POA: Diagnosis present

## 2015-10-02 DIAGNOSIS — E669 Obesity, unspecified: Secondary | ICD-10-CM | POA: Diagnosis present

## 2015-10-02 DIAGNOSIS — Z6841 Body Mass Index (BMI) 40.0 and over, adult: Secondary | ICD-10-CM

## 2015-10-02 DIAGNOSIS — Z9889 Other specified postprocedural states: Secondary | ICD-10-CM

## 2015-10-02 DIAGNOSIS — R531 Weakness: Secondary | ICD-10-CM | POA: Diagnosis present

## 2015-10-02 DIAGNOSIS — Y836 Removal of other organ (partial) (total) as the cause of abnormal reaction of the patient, or of later complication, without mention of misadventure at the time of the procedure: Secondary | ICD-10-CM | POA: Diagnosis present

## 2015-10-02 DIAGNOSIS — T888XXD Other specified complications of surgical and medical care, not elsewhere classified, subsequent encounter: Secondary | ICD-10-CM | POA: Diagnosis not present

## 2015-10-02 DIAGNOSIS — R51 Headache: Secondary | ICD-10-CM | POA: Diagnosis present

## 2015-10-02 DIAGNOSIS — J95830 Postprocedural hemorrhage and hematoma of a respiratory system organ or structure following a respiratory system procedure: Principal | ICD-10-CM | POA: Diagnosis present

## 2015-10-02 DIAGNOSIS — Z881 Allergy status to other antibiotic agents status: Secondary | ICD-10-CM

## 2015-10-02 DIAGNOSIS — Z9089 Acquired absence of other organs: Secondary | ICD-10-CM

## 2015-10-02 DIAGNOSIS — Z888 Allergy status to other drugs, medicaments and biological substances status: Secondary | ICD-10-CM

## 2015-10-02 HISTORY — DX: Acute tonsillitis, unspecified: J03.90

## 2015-10-02 HISTORY — DX: Acquired absence of other organs: Z90.89

## 2015-10-02 HISTORY — PX: TONSILLECTOMY AND ADENOIDECTOMY: SHX28

## 2015-10-02 HISTORY — DX: Adverse effect of unspecified anesthetic, initial encounter: T41.45XA

## 2015-10-02 HISTORY — DX: Other complications of anesthesia, initial encounter: T88.59XA

## 2015-10-02 SURGERY — TONSILLECTOMY AND ADENOIDECTOMY
Anesthesia: General | Site: Throat | Wound class: Clean Contaminated

## 2015-10-02 MED ORDER — SUCCINYLCHOLINE CHLORIDE 20 MG/ML IJ SOLN
INTRAMUSCULAR | Status: DC | PRN
  Administered 2015-10-02: 100 mg via INTRAVENOUS

## 2015-10-02 MED ORDER — FLUMAZENIL 0.5 MG/5ML IV SOLN
INTRAVENOUS | Status: AC
  Administered 2015-10-02: 10 mg
  Filled 2015-10-02: qty 5

## 2015-10-02 MED ORDER — DEXTROSE-NACL 5-0.45 % IV SOLN
INTRAVENOUS | Status: DC
  Administered 2015-10-02 (×2): via INTRAVENOUS

## 2015-10-02 MED ORDER — OXYCODONE HCL 5 MG PO TABS: 5.0000 mg | ORAL_TABLET | Freq: Once | ORAL | Status: DC | PRN

## 2015-10-02 MED ORDER — IBUPROFEN 100 MG/5ML PO SUSP
400.0000 mg | Freq: Four times a day (QID) | ORAL | Status: DC | PRN
  Administered 2015-10-02 – 2015-10-03 (×3): 400 mg via ORAL
  Filled 2015-10-02 (×3): qty 20

## 2015-10-02 MED ORDER — BUPIVACAINE HCL 0.5 % IJ SOLN
INTRAMUSCULAR | Status: DC | PRN
  Administered 2015-10-02: 10 mL

## 2015-10-02 MED ORDER — NALOXONE HCL 0.4 MG/ML IJ SOLN
0.4000 mg | INTRAMUSCULAR | Status: DC | PRN
  Filled 2015-10-02: qty 1

## 2015-10-02 MED ORDER — LACTATED RINGERS IV SOLN
INTRAVENOUS | Status: DC
  Administered 2015-10-02 (×2): via INTRAVENOUS

## 2015-10-02 MED ORDER — DEXAMETHASONE SODIUM PHOSPHATE 4 MG/ML IJ SOLN
INTRAMUSCULAR | Status: DC | PRN
  Administered 2015-10-02: 10 mg via INTRAVENOUS

## 2015-10-02 MED ORDER — ONDANSETRON HCL 4 MG PO TABS: 4.0000 mg | ORAL_TABLET | ORAL | Status: DC | PRN

## 2015-10-02 MED ORDER — FENTANYL CITRATE (PF) 100 MCG/2ML IJ SOLN
INTRAMUSCULAR | Status: AC
  Administered 2015-10-02: 50 ug via INTRAVENOUS
  Filled 2015-10-02: qty 2

## 2015-10-02 MED ORDER — FLUMAZENIL 1 MG/10ML IV SOLN
0.2000 mg | Freq: Once | INTRAVENOUS | Status: AC
  Administered 2015-10-02: 0.2 mg via INTRAVENOUS
  Filled 2015-10-02: qty 2

## 2015-10-02 MED ORDER — BUPIVACAINE HCL (PF) 0.5 % IJ SOLN
INTRAMUSCULAR | Status: AC
  Filled 2015-10-02: qty 30

## 2015-10-02 MED ORDER — OXYCODONE HCL 5 MG/5ML PO SOLN: 5.0000 mg | Freq: Once | ORAL | Status: DC | PRN

## 2015-10-02 MED ORDER — ONDANSETRON HCL 4 MG/2ML IJ SOLN
INTRAMUSCULAR | Status: DC | PRN
  Administered 2015-10-02: 4 mg via INTRAVENOUS

## 2015-10-02 MED ORDER — FENTANYL CITRATE (PF) 100 MCG/2ML IJ SOLN
25.0000 ug | INTRAMUSCULAR | Status: DC | PRN
  Administered 2015-10-02: 50 ug via INTRAVENOUS

## 2015-10-02 MED ORDER — FAMOTIDINE 20 MG PO TABS
ORAL_TABLET | ORAL | Status: AC
  Administered 2015-10-02: 20 mg via ORAL
  Filled 2015-10-02: qty 1

## 2015-10-02 MED ORDER — SODIUM CHLORIDE 0.9 % IJ SOLN
INTRAMUSCULAR | Status: AC
  Filled 2015-10-02: qty 10

## 2015-10-02 MED ORDER — FAMOTIDINE 20 MG PO TABS
20.0000 mg | ORAL_TABLET | Freq: Once | ORAL | Status: AC
  Administered 2015-10-02: 20 mg via ORAL

## 2015-10-02 MED ORDER — HYDROCODONE-ACETAMINOPHEN 7.5-325 MG/15ML PO SOLN: 15.0000 mL | ORAL | Status: DC | PRN

## 2015-10-02 MED ORDER — MIDAZOLAM HCL 2 MG/2ML IJ SOLN
INTRAMUSCULAR | Status: DC | PRN
  Administered 2015-10-02: 2 mg via INTRAVENOUS

## 2015-10-02 MED ORDER — IBUPROFEN 100 MG/5ML PO SUSP
ORAL | Status: AC
  Administered 2015-10-02: 400 mg via ORAL
  Filled 2015-10-02: qty 20

## 2015-10-02 MED ORDER — LABETALOL HCL 5 MG/ML IV SOLN
INTRAVENOUS | Status: AC
  Administered 2015-10-02: 10 mg via INTRAVENOUS
  Filled 2015-10-02: qty 4

## 2015-10-02 MED ORDER — ONDANSETRON HCL 4 MG/2ML IJ SOLN
4.0000 mg | INTRAMUSCULAR | Status: DC | PRN
Start: 2015-10-02 — End: 2015-10-03

## 2015-10-02 MED ORDER — FENTANYL CITRATE (PF) 100 MCG/2ML IJ SOLN
INTRAMUSCULAR | Status: DC | PRN
  Administered 2015-10-02: 100 ug via INTRAVENOUS
  Administered 2015-10-02 (×3): 50 ug via INTRAVENOUS

## 2015-10-02 MED ORDER — PROPOFOL 10 MG/ML IV BOLUS
INTRAVENOUS | Status: DC | PRN
  Administered 2015-10-02: 200 mg via INTRAVENOUS

## 2015-10-02 MED ORDER — LABETALOL HCL 5 MG/ML IV SOLN
10.0000 mg | Freq: Once | INTRAVENOUS | Status: AC
  Administered 2015-10-02: 10 mg via INTRAVENOUS

## 2015-10-02 MED ORDER — NALOXONE HCL 2 MG/2ML IJ SOSY
PREFILLED_SYRINGE | INTRAMUSCULAR | Status: AC
  Administered 2015-10-02: 0.4 mg via INTRAVENOUS
  Filled 2015-10-02: qty 2

## 2015-10-02 SURGICAL SUPPLY — 14 items
CANISTER SUCT 1200ML W/VALVE (MISCELLANEOUS) ×2 IMPLANT
CATH ROBINSON RED A/P 8FR (CATHETERS) ×2 IMPLANT
COAG SUCT 10F 3.5MM HAND CTRL (MISCELLANEOUS) ×2 IMPLANT
ELECT CAUTERY BLADE TIP 2.5 (TIP) ×2
ELECTRODE CAUTERY BLDE TIP 2.5 (TIP) ×1 IMPLANT
GLOVE BIO SURGEON STRL SZ7.5 (GLOVE) ×2 IMPLANT
HANDLE SUCTION POOLE (INSTRUMENTS) ×1 IMPLANT
NS IRRIG 500ML POUR BTL (IV SOLUTION) ×2 IMPLANT
PACK HEAD/NECK (MISCELLANEOUS) ×2 IMPLANT
PAD GROUND ADULT SPLIT (MISCELLANEOUS) ×2 IMPLANT
SOL ANTI-FOG 6CC FOG-OUT (MISCELLANEOUS) ×1 IMPLANT
SOL FOG-OUT ANTI-FOG 6CC (MISCELLANEOUS) ×1
SPONGE TONSIL 1 RF SGL (DISPOSABLE) ×2 IMPLANT
SUCTION POOLE HANDLE (INSTRUMENTS) ×2

## 2015-10-02 NOTE — Progress Notes (Signed)
10/02/2015 7:18 PM  Ronnie Smith [id number]  Post-Op Day 0    Temp:  [97.6 F (36.4 C)-99.5 F (37.5 C)] 98.3 F (36.8 C) (10/25 1611) Pulse Rate:  [77-126] 93 (10/25 1611) Resp:  [0-37] 18 (10/25 1611) BP: (112-163)/(70-116) 127/70 mmHg (10/25 1611) SpO2:  [90 %-100 %] 99 % (10/25 1611) FiO2 (%):  [100 %] 100 % (10/25 1230) Weight:  [143.337 kg (316 lb)] 143.337 kg (316 lb) (10/25 0813),     Intake/Output Summary (Last 24 hours) at 10/02/15 1918 Last data filed at 10/02/15 1700  Gross per 24 hour  Intake   1000 ml  Output    700 ml  Net    300 ml    No results found for this or any previous visit (from the past 24 hour(s)).  SUBJECTIVE:  More awake this pm.  No bleeding.  Taking good po.  OBJECTIVE: No bleeding  IMPRESSION:  S/p T & A will watch overnight.  More awake this pm  PLAN:  Home tomorrow  Webb Weed T 10/02/2015, 7:18 PM

## 2015-10-02 NOTE — Op Note (Signed)
PREOPERATIVE DIAGNOSIS:  TONSIL AND ADENOID HYPERTROPHY, ACUTE TONSILLITIS  POSTOPERATIVE DIAGNOSIS: Same  OPERATION:  Tonsillectomy and adenoidectomy.  SURGEON:  Roena Malady, MD  ANESTHESIA:  General endotracheal.  OPERATIVE FINDINGS:  Large tonsils and adenoids.  DESCRIPTION OF THE PROCEDURE:  Ronnie Smith was identified in the holding area and taken to the operating room and placed in the supine position.  After general endotracheal anesthesia, the table was turned 45 degrees and the patient was draped in the usual fashion for a tonsillectomy.  A mouth gag was inserted into the oral cavity and examination of the oropharynx showed the uvula was non-bifid.  There was no evidence of submucous cleft to the palate.  There were large tonsils.  A red rubber catheter was placed through the nostril.  Examination of the nasopharynx showed large obstructing adenoids.  Under indirect vision with the mirror, an adenotome was placed in the nasopharynx.  The adenoids were curetted free.  Reinspection with a mirror showed excellent removal of the adenoid.  Nasopharyngeal packs were then placed.  The operation then turned to the tonsillectomy.  Beginning on the left-hand side a tenaculum was used to grasp the tonsil and the Bovie cautery was used to dissect it free from the fossa.  In a similar fashion, the right tonsil was removed.  Meticulous hemostasis was achieved using the Bovie cautery.  With both tonsils removed and no active bleeding, the nasopharyngeal packs were removed.  Suction cautery was then used to cauterize the nasopharyngeal bed to prevent bleeding.  The red rubber catheter was removed with no active bleeding.  0.5% plain Marcaine was used to inject the anterior and posterior tonsillar pillars bilaterally.  A total of 9 ml was used.  The patient tolerated the procedure well and was awakened in the operating room and taken to the recovery room in stable condition.   CULTURES:   None.  SPECIMENS:  Tonsils and adenoids.  ESTIMATED BLOOD LOSS:  Less than 20 ml.  Ronnie Smith  10/02/2015  10:17 AM

## 2015-10-02 NOTE — Transfer of Care (Signed)
Immediate Anesthesia Transfer of Care Note  Patient: CHING Smith  Procedure(s) Performed: Procedure(s): TONSILLECTOMY AND ADENOIDECTOMY (N/A)  Patient Location: PACU  Anesthesia Type:General  Level of Consciousness: sedated and patient cooperative  Airway & Oxygen Therapy: Patient Spontanous Breathing and Patient connected to face mask oxygen  Post-op Assessment: Report given to RN and Post -op Vital signs reviewed and stable  Post vital signs: Reviewed and stable  Last Vitals:  Filed Vitals:   10/02/15 1028  BP: 157/71  Pulse: 91  Temp: 36.6 C  Resp: 21    Complications: No apparent anesthesia complications

## 2015-10-02 NOTE — Anesthesia Procedure Notes (Signed)
Procedure Name: Intubation Date/Time: 10/02/2015 9:47 AM Performed by: Jonna Clark Pre-anesthesia Checklist: Patient identified, Patient being monitored, Timeout performed, Emergency Drugs available and Suction available Patient Re-evaluated:Patient Re-evaluated prior to inductionOxygen Delivery Method: Circle system utilized Preoxygenation: Pre-oxygenation with 100% oxygen Intubation Type: IV induction Ventilation: Mask ventilation without difficulty Laryngoscope Size: Miller and 2 Grade View: Grade I Tube type: Oral Rae Tube size: 7.0 mm Number of attempts: 1 Placement Confirmation: ETT inserted through vocal cords under direct vision,  positive ETCO2 and breath sounds checked- equal and bilateral Secured at: 21 cm Tube secured with: Tape Dental Injury: Teeth and Oropharynx as per pre-operative assessment

## 2015-10-02 NOTE — OR Nursing (Addendum)
10:37 Jerking Rt arm and head.  Dr Amie Critchley  notified. Dr Amie Critchley at bedside.

## 2015-10-02 NOTE — H&P (Signed)
  H+P  Reviewed and will be scanned in later. No changes noted. 

## 2015-10-02 NOTE — OR Nursing (Signed)
Periods of apnea followed by heavy breathing Dr Amie Critchley at bedside.

## 2015-10-02 NOTE — OR Nursing (Signed)
Dr Con Memos notified regarding patient status.  "Check regarding possible use of Narcan."

## 2015-10-02 NOTE — Anesthesia Preprocedure Evaluation (Signed)
Anesthesia Evaluation  Patient identified by MRN, date of birth, ID band Patient awake    Reviewed: Allergy & Precautions, H&P , NPO status , Patient's Chart, lab work & pertinent test results  History of Anesthesia Complications Negative for: history of anesthetic complications  Airway Mallampati: II  TM Distance: >3 FB Neck ROM: full    Dental no notable dental hx. (+) Teeth Intact   Pulmonary neg pulmonary ROS, neg shortness of breath,    Pulmonary exam normal breath sounds clear to auscultation       Cardiovascular Exercise Tolerance: Good (-) angina(-) Past MI and (-) DOE negative cardio ROS Normal cardiovascular exam Rhythm:regular Rate:Normal     Neuro/Psych negative neurological ROS  negative psych ROS   GI/Hepatic negative GI ROS, Neg liver ROS,   Endo/Other  Morbid obesity  Renal/GU negative Renal ROS  negative genitourinary   Musculoskeletal   Abdominal   Peds  Hematology negative hematology ROS (+)   Anesthesia Other Findings Past Medical History:   Colitis                                                      Tonsillitis                                                 Past Surgical History:   INCISION AND DRAINAGE PERIRECTAL ABSCESS        N/A 04/19/2015      Comment:Procedure: IRRIGATION AND DEBRIDEMENT               PERIRECTAL ABSCESS;  Surgeon: Florene Glen,              MD;  Location: [facility] ORS;  Service: General;                Laterality: N/A;   INCISION AND DRAINAGE PERIRECTAL ABSCESS         12-2014      BMI    Body Mass Index   40.55 kg/m 2      Reproductive/Obstetrics negative OB ROS                             Anesthesia Physical Anesthesia Plan  ASA: III  Anesthesia Plan: General ETT   Post-op Pain Management:    Induction:   Airway Management Planned:   Additional Equipment:   Intra-op Plan:   Post-operative Plan:   Informed Consent: I  have reviewed the patients History and Physical, chart, labs and discussed the procedure including the risks, benefits and alternatives for the proposed anesthesia with the patient or authorized representative who has indicated his/her understanding and acceptance.   Dental Advisory Given  Plan Discussed with: Anesthesiologist, CRNA and Surgeon  Anesthesia Plan Comments:         Anesthesia Quick Evaluation

## 2015-10-02 NOTE — Anesthesia Postprocedure Evaluation (Addendum)
  Anesthesia Post-op Note  Patient: Ronnie Smith  Procedure(s) Performed: Procedure(s): TONSILLECTOMY AND ADENOIDECTOMY (N/A)  Anesthesia type:General ETT  Patient location: PACU  Post pain: Pain level controlled  Post assessment: Post-op Vital signs reviewed, Patient's Cardiovascular Status Stable, Respiratory Function Stable, Patent Airway and No signs of Nausea or vomiting  Post vital signs: Reviewed and stable  Last Vitals:  Filed Vitals:   10/02/15 1357  BP: 136/95  Pulse: 90  Temp: 37.5 C  Resp: 16    Level of consciousness: awake, alert  and patient cooperative  Complications: Delayed emergence.  Waxing and waning delirium which resolved by time of PACU discharge and was not improved by narcan or flumazenil.  Patient neurologically intact, answering questions and joking with mother at time of PACU discharge.  Plan to admit for monitoring in short stay unit due to apneic episodes in the PACU.

## 2015-10-03 ENCOUNTER — Emergency Department
Admission: EM | Admit: 2015-10-03 | Discharge: 2015-10-04 | Disposition: A | Discharge status: Home / Self Care | Payer: BC Managed Care – PPO | Source: Home / Self Care | Attending: Emergency Medicine | Admitting: Emergency Medicine

## 2015-10-03 ENCOUNTER — Encounter: Payer: Self-pay | Admitting: *Deleted

## 2015-10-03 DIAGNOSIS — L7622 Postprocedural hemorrhage and hematoma of skin and subcutaneous tissue following other procedure: Secondary | ICD-10-CM

## 2015-10-03 LAB — SURGICAL PATHOLOGY

## 2015-10-03 MED ORDER — FENTANYL CITRATE (PF) 100 MCG/2ML IJ SOLN
50.0000 ug | Freq: Once | INTRAMUSCULAR | Status: AC
Start: 2015-10-03 — End: 2015-10-03
  Administered 2015-10-03: 25 ug via INTRAVENOUS
  Filled 2015-10-03: qty 2

## 2015-10-03 NOTE — ED Notes (Signed)
Family and Ronnie Smith. Report having toncilectomy performed yesterday by Dr. Tami Ribas, Ronnie Smith. Discharged this morning.  Ronnie Smith. States around 4 pm today he started coughing and coughed up some bright red blood.  Ronnie Smith. States he coughed up blood multiple times this evening before coming to ED.

## 2015-10-03 NOTE — ED Notes (Signed)
Pt reports tonsilectomy yesterday, stayed one night in hospital. Today began to throw up blood around 4pm. Has bag of bright red blood in triage. Dr. Tami Ribas did surgery.

## 2015-10-03 NOTE — Progress Notes (Signed)
Patient discharged to home as ordered. Patient given prescription for pain medication. IV discontinued to left arm site without s/s of infiltration or infection. Patient given motrin liquid for pain, patient mother will be here in 20 minutes to take patient home. Mr Varady is alert and oriented ambulates without assistance able to tolerate a clear liquid diet.

## 2015-10-03 NOTE — ED Provider Notes (Signed)
[facility] Emergency Department Provider Note  ____________________________________________  Time seen: 11:05PM  I have reviewed the triage vital signs and the nursing notes.   HISTORY  Chief Complaint Post-op Problem      HPI Ronnie Smith is a 19 y.o. male presents with history of tonsillectomy performed yesterday by Dr. Tami Ribas. Patient states today after having an episode of coughing at 4 PM he started coughing up bright red blood patient brought in the sample which is approximately half a cup. Patient has had no further hemoptysis in the emergency department. Patient states that his current pain score is 8 out of 10 located in his throat.     Past Medical History  Diagnosis Date  . Colitis   . Tonsillitis   . Complication of anesthesia     Prolonged Emergence    Patient Active Problem List   Diagnosis Date Noted  . S/P tonsillectomy 10/02/2015  . Perirectal abscess 04/17/2015    Past Surgical History  Procedure Laterality Date  . Incision and drainage perirectal abscess N/A 04/19/2015    Procedure: IRRIGATION AND DEBRIDEMENT PERIRECTAL ABSCESS;  Surgeon: Florene Glen, MD;  Location: [facility] ORS;  Service: General;  Laterality: N/A;  . Incision and drainage perirectal abscess  12-2014  . Tonsillectomy and adenoidectomy N/A 10/02/2015    Procedure: TONSILLECTOMY AND ADENOIDECTOMY;  Surgeon: Beverly Gust, MD;  Location: [facility] ORS;  Service: ENT;  Laterality: N/A;    Current Outpatient Rx  Name  Route  Sig  Dispense  Refill  . amoxicillin-clavulanate (AUGMENTIN) 875-125 MG tablet   Oral   Take 1 tablet by mouth 2 (two) times daily.         . cephALEXin (KEFLEX) 500 MG capsule   Oral   Take 1 capsule (500 mg total) by mouth 2 (two) times daily. Patient not taking: Reported on 09/27/2015   20 capsule   0   . HYDROcodone-acetaminophen (HYCET) 7.5-325 mg/15 ml solution   Oral   Take 15 mLs by mouth every 4 (four) hours as needed  for moderate pain.   650 mL   0   . ondansetron (ZOFRAN) 4 MG tablet      Take 1-2 tabs by mouth every 8 hours as needed for nausea/vomiting   30 tablet   0     Allergies Doxycycline  No family history on file.  Social History Social History  Substance Use Topics  . Smoking status: Never Smoker   . Smokeless tobacco: Never Used  . Alcohol Use: No    Review of Systems  Constitutional: Negative for fever. Eyes: Negative for visual changes. ENT: Positive for throat pain, and cough number of blood Cardiovascular: Negative for chest pain. Respiratory: Negative for shortness of breath. Gastrointestinal: Negative for abdominal pain, vomiting and diarrhea. Genitourinary: Negative for dysuria. Musculoskeletal: Negative for back pain. Skin: Negative for rash. Neurological: Negative for headaches, focal weakness or numbness.   10-point ROS otherwise negative.  ____________________________________________   PHYSICAL EXAM:  VITAL SIGNS: ED Triage Vitals  Enc Vitals Group     BP 10/03/15 2101 134/81 mmHg     Pulse Rate 10/03/15 2101 117     Resp --      Temp 10/03/15 2101 100.8 F (38.2 C)     Temp Source 10/03/15 2101 Oral     SpO2 10/03/15 2101 97 %     Weight 10/03/15 2101 316 lb (143.337 kg)     Height 10/03/15 2101 6\' 2"  (1.88 m)  Head Cir --      Peak Flow --      Pain Score 10/03/15 2114 8     Pain Loc --      Pain Edu? --      Excl. in [place]? --      Constitutional: Alert and oriented. Well appearing and in no distress. Eyes: Conjunctivae are normal. PERRL. Normal extraocular movements. ENT   Head: Normocephalic and atraumatic.   Nose: No congestion/rhinnorhea.   Mouth/Throat: Mucous membranes are moist. Very scant bleeding noted in the left tonsillar surgical fossa.   Neck: No stridor. Hematological/Lymphatic/Immunilogical: No cervical lymphadenopathy. Cardiovascular: Normal rate, regular rhythm. Normal and symmetric distal pulses are  present in all extremities. No murmurs, rubs, or gallops. Respiratory: Normal respiratory effort without tachypnea nor retractions. Breath sounds are clear and equal bilaterally. No wheezes/rales/rhonchi. Gastrointestinal: Soft and nontender. No distention. There is no CVA tenderness. Genitourinary: deferred Musculoskeletal: Nontender with normal range of motion in all extremities. No joint effusions.  No lower extremity tenderness nor edema. Neurologic:  Normal speech and language. No gross focal neurologic deficits are appreciated. Speech is normal.  Skin:  Skin is warm, dry and intact. No rash noted. Psychiatric: Mood and affect are normal. Speech and behavior are normal. Patient exhibits appropriate insight and judgment.    INITIAL IMPRESSION / ASSESSMENT AND PLAN / ED COURSE  Pertinent labs & imaging results that were available during my care of the patient were reviewed by me and considered in my medical decision making (see chart for details).  I discussed the patient with Dr. Richardson Landry otolaryngologist on call with plan to observe the patient for an additional hour and if no evidence of active bleeding at that time the patient would be discharged home with recommendation for follow-up in the morning. Dr. Richardson Landry agreeable with plan. No further bleeding noted while in the emergency department. I discussed with the patient and his mother at length regarding warning signs to return to the emergency Department immediately namely return of bleeding. Patient and his mother in agreement with discharge plan  ____________________________________________   FINAL CLINICAL IMPRESSION(S) / ED DIAGNOSES  Final diagnoses:  Postoperative hemorrhage of subcutaneous tissue following non-dermatologic procedure      Gregor Hams, MD 10/05/15 [phone]

## 2015-10-03 NOTE — Discharge Summary (Signed)
10/03/2015 8:12 AM  Ronnie Smith [id number]  Post-Op Day 1    Temp:  [97.6 F (36.4 C)-99.5 F (37.5 C)] 98 F (36.7 C) (10/26 0529) Pulse Rate:  [77-126] 88 (10/26 0529) Resp:  [0-37] 16 (10/26 0529) BP: (112-163)/(70-116) 133/80 mmHg (10/26 0529) SpO2:  [90 %-100 %] 100 % (10/26 0529) FiO2 (%):  [100 %] 100 % (10/25 1230) Weight:  [143.337 kg (316 lb)] 143.337 kg (316 lb) (10/25 0813),     Intake/Output Summary (Last 24 hours) at 10/03/15 0812 Last data filed at 10/03/15 0551  Gross per 24 hour  Intake   1480 ml  Output   1600 ml  Net   -120 ml    No results found for this or any previous visit (from the past 24 hour(s)).  SUBJECTIVE:  Feeling better, much more awake this am.  No bleeding  OBJECTIVE:  Moist mucous membranes, no bleeding  IMPRESSION:  S/p T & A more alert.    PLAN:  Home today.  Script for hydrocodone elixir given to patient yesterday.    Sharbel Sahagun T 10/03/2015, 8:12 AM

## 2015-10-04 ENCOUNTER — Emergency Department: Payer: BC Managed Care – PPO | Admitting: Anesthesiology

## 2015-10-04 ENCOUNTER — Encounter: Payer: Self-pay | Admitting: Emergency Medicine

## 2015-10-04 ENCOUNTER — Encounter
Admission: EM | Disposition: A | Discharge status: Home / Self Care | Payer: Self-pay | Source: Home / Self Care | Attending: Otolaryngology

## 2015-10-04 ENCOUNTER — Observation Stay
Admission: EM | Admit: 2015-10-04 | Discharge: 2015-10-04 | Disposition: A | Discharge status: Home / Self Care | Payer: BC Managed Care – PPO | Source: Home / Self Care | Attending: Emergency Medicine | Admitting: Emergency Medicine

## 2015-10-04 ENCOUNTER — Encounter
Admission: EM | Disposition: A | Discharge status: Home / Self Care | Payer: Self-pay | Source: Home / Self Care | Attending: Emergency Medicine

## 2015-10-04 ENCOUNTER — Inpatient Hospital Stay
Admission: EM | Admit: 2015-10-04 | Discharge: 2015-10-07 | DRG: 908 | Disposition: A | Discharge status: Home / Self Care | Payer: BC Managed Care – PPO | Attending: Otolaryngology | Admitting: Otolaryngology

## 2015-10-04 DIAGNOSIS — I10 Essential (primary) hypertension: Secondary | ICD-10-CM | POA: Diagnosis present

## 2015-10-04 DIAGNOSIS — T888XXD Other specified complications of surgical and medical care, not elsewhere classified, subsequent encounter: Secondary | ICD-10-CM

## 2015-10-04 DIAGNOSIS — R041 Hemorrhage from throat: Secondary | ICD-10-CM | POA: Diagnosis present

## 2015-10-04 DIAGNOSIS — J95830 Postprocedural hemorrhage and hematoma of a respiratory system organ or structure following a respiratory system procedure: Secondary | ICD-10-CM | POA: Diagnosis present

## 2015-10-04 HISTORY — PX: TONSILLECTOMY AND ADENOIDECTOMY: SHX28

## 2015-10-04 HISTORY — DX: Hemorrhage from throat: R04.1

## 2015-10-04 HISTORY — PX: TONSILLECTOMY: SHX5217

## 2015-10-04 LAB — COMPREHENSIVE METABOLIC PANEL
ALBUMIN: 4.4 g/dL (ref 3.5–5.0)
ALBUMIN: 4.4 g/dL (ref 3.5–5.0)
ALT: 28 U/L (ref 17–63)
ALT: 25 U/L (ref 17–63)
ANION GAP: 10 (ref 5–15)
AST: 15 U/L (ref 15–41)
AST: 20 U/L (ref 15–41)
Alkaline Phosphatase: 84 U/L (ref 38–126)
Alkaline Phosphatase: 79 U/L (ref 38–126)
Anion gap: 11 (ref 5–15)
BILIRUBIN TOTAL: 0.8 mg/dL (ref 0.3–1.2)
BILIRUBIN TOTAL: 0.7 mg/dL (ref 0.3–1.2)
BUN: 13 mg/dL (ref 6–20)
BUN: 15 mg/dL (ref 6–20)
CHLORIDE: 102 mmol/L (ref 101–111)
CO2: 23 mmol/L (ref 22–32)
CO2: 24 mmol/L (ref 22–32)
Calcium: 9.3 mg/dL (ref 8.9–10.3)
Calcium: 9.3 mg/dL (ref 8.9–10.3)
Chloride: 103 mmol/L (ref 101–111)
Creatinine, Ser: 1.1 mg/dL (ref 0.61–1.24)
Creatinine, Ser: 0.99 mg/dL (ref 0.61–1.24)
GFR calc Af Amer: >60 mL/min (ref >60)
GFR calc Af Amer: >60 mL/min (ref >60)
GFR calc non Af Amer: >60 mL/min (ref >60)
GFR calc non Af Amer: >60 mL/min (ref >60)
GLUCOSE: 95 mg/dL (ref 65–99)
GLUCOSE: 103 mg/dL — AB (ref 65–99)
POTASSIUM: 4 mmol/L (ref 3.5–5.1)
POTASSIUM: 3.9 mmol/L (ref 3.5–5.1)
SODIUM: 136 mmol/L (ref 135–145)
Sodium: 137 mmol/L (ref 135–145)
TOTAL PROTEIN: 8 g/dL (ref 6.5–8.1)
TOTAL PROTEIN: 8.4 g/dL — AB (ref 6.5–8.1)

## 2015-10-04 LAB — CBC
HCT: 45.3 % (ref 40.0–52.0)
HEMATOCRIT: 44.1 % (ref 40.0–52.0)
Hemoglobin: 15.1 g/dL (ref 13.0–18.0)
Hemoglobin: 14.4 g/dL (ref 13.0–18.0)
MCH: 28 pg (ref 26.0–34.0)
MCH: 27 pg (ref 26.0–34.0)
MCHC: 33.4 g/dL (ref 32.0–36.0)
MCHC: 32.6 g/dL (ref 32.0–36.0)
MCV: 84 fL (ref 80.0–100.0)
MCV: 82.8 fL (ref 80.0–100.0)
Platelets: 454 10*3/uL — ABNORMAL HIGH (ref 150–440)
Platelets: 493 10*3/uL — ABNORMAL HIGH (ref 150–440)
RBC: 5.39 MIL/uL (ref 4.40–5.90)
RBC: 5.33 MIL/uL (ref 4.40–5.90)
RDW: 14.4 % (ref 11.5–14.5)
RDW: 14.2 % (ref 11.5–14.5)
WBC: 19.5 10*3/uL — ABNORMAL HIGH (ref 3.8–10.6)
WBC: 24.8 10*3/uL — AB (ref 3.8–10.6)

## 2015-10-04 LAB — TYPE AND SCREEN
ABO/RH(D): O POS
Antibody Screen: NEGATIVE

## 2015-10-04 LAB — APTT: APTT: 37 s — AB (ref 24–36)

## 2015-10-04 LAB — PROTIME-INR
INR: 1.16
PROTHROMBIN TIME: 15 s (ref 11.4–15.0)

## 2015-10-04 SURGERY — TONSILLECTOMY
Anesthesia: General | Site: Mouth | Wound class: Dirty or Infected

## 2015-10-04 SURGERY — TONSILLECTOMY AND ADENOIDECTOMY
Anesthesia: General | Site: Mouth | Wound class: Dirty or Infected

## 2015-10-04 MED ORDER — OXYMETAZOLINE HCL 0.05 % NA SOLN
NASAL | Status: DC | PRN
  Administered 2015-10-04: 1

## 2015-10-04 MED ORDER — OXYCODONE HCL 5 MG PO TABS: 5.0000 mg | ORAL_TABLET | Freq: Once | ORAL | Status: DC | PRN

## 2015-10-04 MED ORDER — FENTANYL CITRATE (PF) 100 MCG/2ML IJ SOLN
INTRAMUSCULAR | Status: DC | PRN
  Administered 2015-10-04: 100 ug via INTRAVENOUS

## 2015-10-04 MED ORDER — ONDANSETRON HCL 4 MG/2ML IJ SOLN: 4.0000 mg | INTRAMUSCULAR | Status: DC | PRN

## 2015-10-04 MED ORDER — PROPOFOL 10 MG/ML IV BOLUS
INTRAVENOUS | Status: DC | PRN
  Administered 2015-10-04: 200 mg via INTRAVENOUS

## 2015-10-04 MED ORDER — SUCCINYLCHOLINE CHLORIDE 20 MG/ML IJ SOLN
INTRAMUSCULAR | Status: DC | PRN
  Administered 2015-10-04: 200 mg via INTRAVENOUS

## 2015-10-04 MED ORDER — SUCCINYLCHOLINE CHLORIDE 20 MG/ML IJ SOLN
INTRAMUSCULAR | Status: DC | PRN
  Administered 2015-10-04: 180 mg via INTRAVENOUS

## 2015-10-04 MED ORDER — FENTANYL CITRATE (PF) 100 MCG/2ML IJ SOLN
INTRAMUSCULAR | Status: AC
  Filled 2015-10-04: qty 2

## 2015-10-04 MED ORDER — ROCURONIUM BROMIDE 100 MG/10ML IV SOLN
INTRAVENOUS | Status: DC | PRN
  Administered 2015-10-04: 10 mg via INTRAVENOUS

## 2015-10-04 MED ORDER — FENTANYL CITRATE (PF) 100 MCG/2ML IJ SOLN: 25.0000 ug | INTRAMUSCULAR | Status: DC | PRN

## 2015-10-04 MED ORDER — ONDANSETRON HCL 4 MG/2ML IJ SOLN: 4.0000 mg | Freq: Once | INTRAMUSCULAR | Status: DC | PRN

## 2015-10-04 MED ORDER — FENTANYL CITRATE (PF) 100 MCG/2ML IJ SOLN
25.0000 ug | Freq: Once | INTRAMUSCULAR | Status: AC
  Administered 2015-10-04: 25 ug via INTRAVENOUS

## 2015-10-04 MED ORDER — LIDOCAINE HCL (CARDIAC) 20 MG/ML IV SOLN
INTRAVENOUS | Status: DC | PRN
  Administered 2015-10-04: 100 mg via INTRAVENOUS

## 2015-10-04 MED ORDER — ONDANSETRON HCL 4 MG PO TABS: 4.0000 mg | ORAL_TABLET | ORAL | Status: DC | PRN

## 2015-10-04 MED ORDER — 0.9 % SODIUM CHLORIDE (POUR BTL) OPTIME
TOPICAL | Status: DC | PRN
  Administered 2015-10-04: 500 mL

## 2015-10-04 MED ORDER — MORPHINE SULFATE (PF) 2 MG/ML IV SOLN
2.0000 mg | Freq: Once | INTRAVENOUS | Status: AC
  Administered 2015-10-04: 2 mg via INTRAVENOUS
  Filled 2015-10-04: qty 1

## 2015-10-04 MED ORDER — DEXAMETHASONE SODIUM PHOSPHATE 4 MG/ML IJ SOLN
INTRAMUSCULAR | Status: DC | PRN
  Administered 2015-10-04: 10 mg via INTRAVENOUS

## 2015-10-04 MED ORDER — ONDANSETRON HCL 4 MG/2ML IJ SOLN
INTRAMUSCULAR | Status: AC
  Administered 2015-10-04: 4 mg
  Filled 2015-10-04: qty 2

## 2015-10-04 MED ORDER — HYDROCODONE-ACETAMINOPHEN 7.5-325 MG/15ML PO SOLN
10.0000 mL | ORAL | Status: DC | PRN
  Administered 2015-10-04: 15 mL via ORAL
  Filled 2015-10-04 (×2): qty 15

## 2015-10-04 MED ORDER — LACTATED RINGERS IV SOLN
INTRAVENOUS | Status: DC | PRN
  Administered 2015-10-04 (×2): via INTRAVENOUS

## 2015-10-04 MED ORDER — MORPHINE SULFATE (PF) 2 MG/ML IV SOLN
INTRAVENOUS | Status: AC
  Filled 2015-10-04: qty 1

## 2015-10-04 MED ORDER — ONDANSETRON HCL 4 MG/2ML IJ SOLN
INTRAMUSCULAR | Status: DC | PRN
  Administered 2015-10-04: 4 mg via INTRAVENOUS

## 2015-10-04 MED ORDER — KCL IN DEXTROSE-NACL 20-5-0.45 MEQ/L-%-% IV SOLN
INTRAVENOUS | Status: DC
  Administered 2015-10-04: 06:00:00 via INTRAVENOUS
  Filled 2015-10-04 (×3): qty 1000

## 2015-10-04 MED ORDER — OXYCODONE HCL 5 MG/5ML PO SOLN: 5.0000 mg | Freq: Once | ORAL | Status: DC | PRN

## 2015-10-04 MED ORDER — FENTANYL CITRATE (PF) 100 MCG/2ML IJ SOLN
25.0000 ug | INTRAMUSCULAR | Status: DC | PRN
  Administered 2015-10-04 – 2015-10-05 (×4): 25 ug via INTRAVENOUS

## 2015-10-04 MED ORDER — OXYMETAZOLINE HCL 0.05 % NA SOLN
NASAL | Status: AC
  Filled 2015-10-04: qty 30

## 2015-10-04 MED ORDER — LACTATED RINGERS IV SOLN
INTRAVENOUS | Status: DC | PRN
  Administered 2015-10-04: 03:00:00 via INTRAVENOUS

## 2015-10-04 SURGICAL SUPPLY — 19 items
BLADE BOVIE TIP EXT 4 (BLADE) ×2 IMPLANT
CANISTER SUCT 1200ML W/VALVE (MISCELLANEOUS) ×2 IMPLANT
CATH ROBINSON RED A/P 10FR (CATHETERS) ×2 IMPLANT
CATH ROBINSON RED A/P 14FR (CATHETERS) ×2 IMPLANT
COAG SUCT 10F 3.5MM HAND CTRL (MISCELLANEOUS) ×2 IMPLANT
GLOVE BIO SURGEON STRL SZ7.5 (GLOVE) ×4 IMPLANT
HANDLE SUCTION POOLE (INSTRUMENTS) ×1 IMPLANT
KIT RM TURNOVER STRD PROC AR (KITS) ×2 IMPLANT
NS IRRIG 500ML POUR BTL (IV SOLUTION) ×2 IMPLANT
PACK HEAD/NECK (MISCELLANEOUS) ×2 IMPLANT
PAD GROUND ADULT SPLIT (MISCELLANEOUS) ×2 IMPLANT
SPONGE TONSIL 1 RF SGL (DISPOSABLE) ×4 IMPLANT
SUCTION POOLE HANDLE (INSTRUMENTS) ×2
SUT VIC AB 4-0 FS2 27 (SUTURE) ×2 IMPLANT
SUT VIC AB 4-0 P-3 18XBRD (SUTURE) ×1 IMPLANT
SUT VIC AB 4-0 P3 18 (SUTURE) ×1
SUT VIC AB 4-0 SH 27 (SUTURE) ×1
SUT VIC AB 4-0 SH 27XANBCTRL (SUTURE) ×1 IMPLANT
SYR 3ML LL SCALE MARK (SYRINGE) ×2 IMPLANT

## 2015-10-04 SURGICAL SUPPLY — 20 items
CANISTER SUCT 1200ML W/VALVE (MISCELLANEOUS) ×2 IMPLANT
CATH ROBINSON RED A/P 10FR (CATHETERS) IMPLANT
COAG SUCT 10F 3.5MM HAND CTRL (MISCELLANEOUS) ×2 IMPLANT
ELECT CAUTERY BLADE TIP 2.5 (TIP) ×2
ELECTRODE CAUTERY BLDE TIP 2.5 (TIP) ×1 IMPLANT
GLOVE BIO SURGEON STRL SZ7.5 (GLOVE) ×2 IMPLANT
GOWN STRL REUS W/ TWL LRG LVL3 (GOWN DISPOSABLE) ×2 IMPLANT
GOWN STRL REUS W/TWL LRG LVL3 (GOWN DISPOSABLE) ×2
KIT RM TURNOVER STRD PROC AR (KITS) ×2 IMPLANT
LABEL OR SOLS (LABEL) ×2 IMPLANT
NEEDLE HYPO 25GX1X1/2 BEV (NEEDLE) ×2 IMPLANT
NS IRRIG 500ML POUR BTL (IV SOLUTION) ×2 IMPLANT
PACK HEAD/NECK (MISCELLANEOUS) ×2 IMPLANT
PAD GROUND ADULT SPLIT (MISCELLANEOUS) ×2 IMPLANT
PENCIL ELECTRO HAND CTR (MISCELLANEOUS) ×2 IMPLANT
SOL ANTI-FOG 6CC FOG-OUT (MISCELLANEOUS) ×1 IMPLANT
SOL FOG-OUT ANTI-FOG 6CC (MISCELLANEOUS) ×1
SPONGE TONSIL 1 RF SGL (DISPOSABLE) ×2 IMPLANT
SYR CONTROL 10ML (SYRINGE) ×2 IMPLANT
SYRINGE 10CC LL (SYRINGE) ×2 IMPLANT

## 2015-10-04 NOTE — ED Notes (Signed)
Reviewed d/c instructions, and follow-up care with pt. Pt verbalized understanding 

## 2015-10-04 NOTE — Anesthesia Procedure Notes (Signed)
Procedure Name: Intubation Date/Time: 10/04/2015 3:26 AM Performed by: Lendon Colonel Pre-anesthesia Checklist: Emergency Drugs available, Patient identified, Suction available, Patient being monitored and Timeout performed Patient Re-evaluated:Patient Re-evaluated prior to inductionOxygen Delivery Method: Circle system utilized Preoxygenation: Pre-oxygenation with 100% oxygen Intubation Type: IV induction and Cricoid Pressure applied Laryngoscope Size: Miller and 3 Grade View: Grade II

## 2015-10-04 NOTE — H&P (Signed)
Ronnie Smith, Ronnie Smith [id number] 09/16/1996 Riley Nearing, MD  Reason for Consult: Bleeding after tonsillectomy Requesting Physician: Gregor Hams, MD Consulting Physician: Riley Nearing, MD  HPI: This 19 y.o. year old male was admitted on 10/04/2015 for Spiting up Blood post tonsillectomy bleed. The patient underwent tonsillectomy by Dr. Tami Ribas on Tuesday, and came to the emergency room earlier this evening with bleeding from the pharynx. Dr. Owens Shark noted that there seemed to be some blood on the left side of the throat, but initially it seemed to be resolving. He was observed for several hours but unfortunately the bleeding recurred when he got home. He now returns to the emergency room with active bleeding. There is fresh blood from the throat and he has spit up over 2 cups of blood. His mother notes that he had an extended period of recovery from anesthesia after surgery.  Medications:  No current facility-administered medications for this encounter.   Current Outpatient Prescriptions  Medication Sig Dispense Refill  . amoxicillin-clavulanate (AUGMENTIN) 875-125 MG tablet Take 1 tablet by mouth 2 (two) times daily.    . cephALEXin (KEFLEX) 500 MG capsule Take 1 capsule (500 mg total) by mouth 2 (two) times daily. (Patient not taking: Reported on 09/27/2015) 20 capsule 0  . HYDROcodone-acetaminophen (HYCET) 7.5-325 mg/15 ml solution Take 15 mLs by mouth every 4 (four) hours as needed for moderate pain. 650 mL 0  . ondansetron (ZOFRAN) 4 MG tablet Take 1-2 tabs by mouth every 8 hours as needed for nausea/vomiting 30 tablet 0  .  (Not in a hospital admission)  Allergies:  Allergies  Allergen Reactions  . Doxycycline Rash    PMH:  Past Medical History  Diagnosis Date  . Colitis   . Tonsillitis   . Complication of anesthesia     Prolonged Emergence    Fam Hx: History reviewed. No pertinent family history.  Soc Hx:  Social History   Social History  . Marital Status: Single     Spouse Name: N/A  . Number of Children: N/A  . Years of Education: N/A   Occupational History  . Not on file.   Social History Main Topics  . Smoking status: Never Smoker   . Smokeless tobacco: Never Used  . Alcohol Use: No  . Drug Use: No  . Sexual Activity: Not on file   Other Topics Concern  . Not on file   Social History Narrative    PSH:  Past Surgical History  Procedure Laterality Date  . Incision and drainage perirectal abscess N/A 04/19/2015    Procedure: IRRIGATION AND DEBRIDEMENT PERIRECTAL ABSCESS;  Surgeon: Florene Glen, MD;  Location: [facility] ORS;  Service: General;  Laterality: N/A;  . Incision and drainage perirectal abscess  12-2014  . Tonsillectomy and adenoidectomy N/A 10/02/2015    Procedure: TONSILLECTOMY AND ADENOIDECTOMY;  Surgeon: Beverly Gust, MD;  Location: [facility] ORS;  Service: ENT;  Laterality: N/A;  . Procedures since admission: TONSILLECTOMY AND ADENOIDECTOMY  ROS: Review of systems normal other than 12 systems except per HPI.  PHYSICAL EXAM  Vitals: Blood pressure 153/112, temperature 100.5 F (38.1 C), temperature source Oral, resp. rate 18, weight 143.3 kg (315 lb 14.7 oz), SpO2 98 %.. General: Well-developed, Well-nourished, distressed due to the bleeding from the throat Mood: Mood and affect well adjusted, cooperative. Orientation: Grossly alert and oriented. Vocal Quality: No hoarseness. Communicates verbally. head and Face: NCAT. No facial asymmetry. No visible skin lesions. No significant facial scars. No tenderness with sinus percussion.  Facial strength normal and symmetric. Ears: External ears with normal landmarks, no lesions. External auditory canals free of infection, cerumen impaction or lesions. Tympanic membranes intact with good landmarks and normal mobility on pneumatic otoscopy. No middle ear effusion. Hearing: Speech reception grossly normal. Nose: External nose normal with midline dorsum and no lesions or deformity. Nasal  Cavity reveals essentially midline septum with normal inferior turbinates. No significant mucosal congestion or erythema. Nasal secretions are minimal and clear. No polyps seen on anterior rhinoscopy. Oral Cavity/ Oropharynx: Lips are normal with no lesions. Teeth no frank dental caries. Gingiva healthy with no lesions or gingivitis. There is fresh blood in the oropharynx although it is difficult to identify the exact source. There appears to be some clot over the left tonsillar fossa but exam is limited as he cannot open his mouth well Indirect Laryngoscopy/Nasopharyngoscopy: Visualization of the larynx, hypopharynx and nasopharynx is not possible in this setting with routine examination. Neck: Supple and symmetric with no palpable masses, tenderness or crepitance. The trachea is midline. Thyroid gland is soft, nontender and symmetric with no masses or enlargement. Parotid and submandibular glands are soft, nontender and symmetric, without masses. Lymphatic: Cervical lymph nodes are without palpable lymphadenopathy or tenderness. Respiratory: Normal respiratory effort without labored breathing. Cardiovascular: Carotid pulse shows regular rate and rhythm Neurologic: Cranial Nerves II through XII are grossly intact. Eyes: Gaze and Ocular Motility are grossly normal. PERRLA. No visible nystagmus.  MEDICAL DECISION MAKING: Data Review:  Results for orders placed or performed during the hospital encounter of 10/03/15 (from the past 48 hour(s))  CBC     Status: Abnormal   Collection Time: 10/03/15 10:35 PM  Result Value Ref Range   WBC 19.5 (H) 3.8 - 10.6 K/uL   RBC 5.39 4.40 - 5.90 MIL/uL   Hemoglobin 15.1 13.0 - 18.0 g/dL   HCT 45.3 40.0 - 52.0 %   MCV 84.0 80.0 - 100.0 fL   MCH 28.0 26.0 - 34.0 pg   MCHC 33.4 32.0 - 36.0 g/dL   RDW 14.4 11.5 - 14.5 %   Platelets 454 (H) 150 - 440 K/uL  Comprehensive metabolic panel     Status: None   Collection Time: 10/03/15 10:35 PM  Result Value Ref Range    Sodium 136 135 - 145 mmol/L   Potassium 4.0 3.5 - 5.1 mmol/L   Chloride 103 101 - 111 mmol/L   CO2 23 22 - 32 mmol/L   Glucose, Bld 95 65 - 99 mg/dL   BUN 13 6 - 20 mg/dL   Creatinine, Ser 1.10 0.61 - 1.24 mg/dL   Calcium 9.3 8.9 - 10.3 mg/dL   Total Protein 8.0 6.5 - 8.1 g/dL   Albumin 4.4 3.5 - 5.0 g/dL   AST 15 15 - 41 U/L   ALT 28 17 - 63 U/L   Alkaline Phosphatase 84 38 - 126 U/L   Total Bilirubin 0.8 0.3 - 1.2 mg/dL   GFR calc non Af Amer >60 >60 mL/min   GFR calc Af Amer >60 >60 mL/min    Comment: (NOTE) The eGFR has been calculated using the CKD EPI equation. This calculation has not been validated in all clinical situations. eGFR's persistently <60 mL/min signify possible Chronic Kidney Disease.    Anion gap 10 5 - 15  . No results found..   ASSESSMENT: Status post tonsillectomy with post tonsillectomy hemorrhage PLAN: Unfortunately this has not resolved after period of observation and he now has further active bleeding from the  pharynx. Visualization is limited and he certainly needs to be taken to the operating room for control of this. This was discussed with his mother and she agrees.   Riley Nearing, MD 10/04/2015 2:59 AM

## 2015-10-04 NOTE — ED Notes (Signed)
Pt arrived to the ED accompanied by his mother for complaints of "throwing up blood." Pt was seen in this ED for the same and discharged about 30 in ago. Pt had a tonsillectomy done 1 day ago. Pt is AOx4 in mild distress and nauseous. ENT was contacted for evaluation.

## 2015-10-04 NOTE — Progress Notes (Signed)
..   10/04/2015 11:26 PM  Ronnie Smith [id number]  Post-Op Day 0, 0, 3    Temp:  [98.1 F (36.7 C)-100.5 F (38.1 C)] 98.1 F (36.7 C) (10/27 0733) Pulse Rate:  [78-136] 126 (10/27 1945) Resp:  [14-20] 18 (10/27 1917) BP: (120-153)/(67-112) 143/90 mmHg (10/27 1930) SpO2:  [96 %-99 %] 98 % (10/27 1945) Weight:  [134.628 kg (296 lb 12.8 oz)-143.3 kg (315 lb 14.7 oz)] 142.883 kg (315 lb) (10/27 1829),    No intake or output data in the 24 hours ending 10/04/15 2326  Results for orders placed or performed during the hospital encounter of 10/04/15 (from the past 24 hour(s))  CBC     Status: Abnormal   Collection Time: 10/04/15  6:42 PM  Result Value Ref Range   WBC 24.8 (H) 3.8 - 10.6 K/uL   RBC 5.33 4.40 - 5.90 MIL/uL   Hemoglobin 14.4 13.0 - 18.0 g/dL   HCT 44.1 40.0 - 52.0 %   MCV 82.8 80.0 - 100.0 fL   MCH 27.0 26.0 - 34.0 pg   MCHC 32.6 32.0 - 36.0 g/dL   RDW 14.2 11.5 - 14.5 %   Platelets 493 (H) 150 - 440 K/uL  Comprehensive metabolic panel     Status: Abnormal   Collection Time: 10/04/15  6:42 PM  Result Value Ref Range   Sodium 137 135 - 145 mmol/L   Potassium 3.9 3.5 - 5.1 mmol/L   Chloride 102 101 - 111 mmol/L   CO2 24 22 - 32 mmol/L   Glucose, Bld 103 (H) 65 - 99 mg/dL   BUN 15 6 - 20 mg/dL   Creatinine, Ser 0.99 0.61 - 1.24 mg/dL   Calcium 9.3 8.9 - 10.3 mg/dL   Total Protein 8.4 (H) 6.5 - 8.1 g/dL   Albumin 4.4 3.5 - 5.0 g/dL   AST 20 15 - 41 U/L   ALT 25 17 - 63 U/L   Alkaline Phosphatase 79 38 - 126 U/L   Total Bilirubin 0.7 0.3 - 1.2 mg/dL   GFR calc non Af Amer >60 >60 mL/min   GFR calc Af Amer >60 >60 mL/min   Anion gap 11 5 - 15  APTT     Status: Abnormal   Collection Time: 10/04/15  6:42 PM  Result Value Ref Range   aPTT 37 (H) 24 - 36 seconds  Protime-INR     Status: None   Collection Time: 10/04/15  6:42 PM  Result Value Ref Range   Prothrombin Time 15.0 11.4 - 15.0 seconds   INR 1.16   Type and screen [facility]      Status: None   Collection Time: 10/04/15  7:04 PM  Result Value Ref Range   ABO/RH(D) O POS    Antibody Screen NEG    Sample Expiration 10/07/2015     IMPRESSION:  Underwent second control of post-tonsillectomy hemorrhage  PLAN:  Admit for observation and workup for coagulopathy.  Medicine evaluation.  Possible heme-onc as well.  Will recheck CBC in a.m.  Begin IV antibiotics given fevers.  Ronnie Smith 10/04/2015, 11:26 PM

## 2015-10-04 NOTE — Op Note (Signed)
..  10/04/2015  11:13 PM    Ronnie Smith  [id number]   Pre-Op Dx:  Post-tonsillectomy hemorrhage, subsequent encounter [T88.8XXD]  Post-op Dx: Post-tonsillectomy hemorrhage, subsequent encounter [T88.8XXD]  Proc:Control of Post-tonsillectomy hemorrhage  Surg: Ronnie Smith  Anes:  General Endotracheal  EBL:  100  Comp:  None  Findings:  Significant bleeding arterial vessel on patient's right superior pole and right mid pole of tonsillar fossa, moderate diffuse bleeding from left superior, mid, and inferior tonsillar fossa.  Procedure: After the patient was identified in holding and the history and physical and consent was reviewed, the patient was taken to the operating room urgently due to active bleeding and placed in a semi-supine position.  General endotracheal anesthesia was induced in rapid sequence.  The patient desaturated quickly into the 40's and significant clot was suctioned from his oropharynx.  The larynx was visualized and an ETT was placed and secured.  At this time, the patient was rotated 45 degrees and a shoulder roll was placed.  At this time, a McIvor mouthgag was inserted into the patient's oral cavity and suspended from the [place] stand without injury to teeth, lips, or gums. This demonstrated both tonsillar fossas to be filled with clot.  The patient's left clot was removed and this demonstrated active venous bleeding from multiple sites that were cauterized with bovie suction cautery.  Hemostasis was achieved and at this time, the mouth gag was released from suspension for 1 minute.  Attention now was directed to the patient's right side.  In a similar fashion the clot was removed.  This demonstrated an arterial pumping vessel from the right mid-pole that was extensively cauterized with bovie suction cautery.  A second area of bleeding less pronounced was at the inferior pole at the junction of the patient's tongue and inferior tonsillar pole.  This was  extensively cauterized.  Superiorly just lateral to the [place] there was a second arterial bleeding vessel as well.  This bleed intermittently and took extensive cauterization to control.    At this time, the patient's nasal cavity and oral cavity was irrigated with sterile saline.  The patient's posterior pillar was brought forward with one stitch on each side to the anterior pillar superiorly for improved airway management.    Following this  The care of patient was returned to anesthesia, awakened, and transferred to recovery in stable condition  Dispo:  PACU to floor for coagulopathy workup due to recurrent bleeding despite surgical control.   Herald Vallin 11:13 PM 10/04/2015

## 2015-10-04 NOTE — ED Provider Notes (Signed)
[facility] Emergency Department Provider Note  ____________________________________________  Time seen: On arrival  I have reviewed the triage vital signs and the nursing notes.   HISTORY  Chief Complaint Post-op Problem    HPI Ronnie Smith is a 19 y.o. male who presents with post-tonsillectomy bleeding. Patient and his tonsils removed on the 25th of this month he had to be admitted for continued bleeding on the 26 and was discharged this morning on the 27th. At that time he is not bleeding but apparently he started to have bleeding around 3 PM and it has been continuous since then. He denies shortness of breath. No fevers no chills. He has spit blood into a coffee cup and reports he filled up proximally a third of a coffee cup in the last hour.     Past Medical History  Diagnosis Date  . Colitis   . Tonsillitis   . Complication of anesthesia     Prolonged Emergence    Patient Active Problem List   Diagnosis Date Noted  . Hemorrhage pharyngeal 10/04/2015  . S/P tonsillectomy 10/02/2015  . Perirectal abscess 04/17/2015    Past Surgical History  Procedure Laterality Date  . Incision and drainage perirectal abscess N/A 04/19/2015    Procedure: IRRIGATION AND DEBRIDEMENT PERIRECTAL ABSCESS;  Surgeon: Florene Glen, MD;  Location: [facility] ORS;  Service: General;  Laterality: N/A;  . Incision and drainage perirectal abscess  12-2014  . Tonsillectomy and adenoidectomy N/A 10/02/2015    Procedure: TONSILLECTOMY AND ADENOIDECTOMY;  Surgeon: Beverly Gust, MD;  Location: [facility] ORS;  Service: ENT;  Laterality: N/A;    Current Outpatient Rx  Name  Route  Sig  Dispense  Refill  . HYDROcodone-acetaminophen (HYCET) 7.5-325 mg/15 ml solution   Oral   Take 15 mLs by mouth every 4 (four) hours as needed for moderate pain.   650 mL   0   . ondansetron (ZOFRAN) 4 MG tablet      Take 1-2 tabs by mouth every 8 hours as needed for nausea/vomiting   30  tablet   0     Allergies Doxycycline  No family history on file.  Social History Social History  Substance Use Topics  . Smoking status: Never Smoker   . Smokeless tobacco: Never Used  . Alcohol Use: No    Review of Systems  Constitutional: Negative for fever. Eyes: Negative for visual changes. ENT: As above for bleeding Cardiovascular: Negative for chest pain. Respiratory: Negative for shortness of breath. Gastrointestinal: Negative for abdominal pain, vomiting and diarrhea. Genitourinary: Negative for dysuria. Musculoskeletal: Negative for back pain. Skin: Negative for rash. Neurological: Negative for headaches or focal weakness Psychiatric: No anxiety    ____________________________________________   PHYSICAL EXAM:  VITAL SIGNS: ED Triage Vitals  Enc Vitals Group     BP 10/04/15 1830 149/99 mmHg     Pulse Rate 10/04/15 1830 119     Resp 10/04/15 1830 20     Temp --      Temp src --      SpO2 10/04/15 1830 98 %     Weight 10/04/15 1829 315 lb (142.883 kg)     Height 10/04/15 1829 6\' 2"  (1.88 m)     Head Cir --      Peak Flow --      Pain Score 10/04/15 1828 9     Pain Loc --      Pain Edu? --      Excl. in [place]? --  Constitutional: Alert and oriented. Well appearing and in no distress. Eyes: Conjunctivae are normal.  ENT   Head: Normocephalic and atraumatic.   Mouth/Throat: Limited exam secondary to habitus. Evidence of mild active bleeding in the left posterior pharynx Cardiovascular: Tachycardia, regular rhythm. Normal and symmetric distal pulses are present in all extremities. No murmurs, rubs, or gallops. Respiratory: Normal respiratory effort without tachypnea nor retractions. Breath sounds are clear and equal bilaterally.  Gastrointestinal: Soft and non-tender in all quadrants. No distention. There is no CVA tenderness. Genitourinary: deferred Musculoskeletal: Nontender with normal range of motion in all extremities. No lower  extremity tenderness nor edema. Neurologic:  Normal speech and language. No gross focal neurologic deficits are appreciated. Skin:  Skin is warm, dry and intact. No rash noted. Psychiatric: Mood and affect are normal. Patient exhibits appropriate insight and judgment.  ____________________________________________    LABS (pertinent positives/negatives)  Labs Reviewed - No data to display  ____________________________________________   EKG  ED ECG REPORT I, Lavonia Drafts, the attending physician, personally viewed and interpreted this ECG.   Date: 10/04/2015  EKG Time: 7:01 PM  Rate: 126  Rhythm: sinus tachycardia  Axis: Right axis deviation  Intervals:none  ST&T Change: Non-specific   ____________________________________________    RADIOLOGY I have personally reviewed any xrays that were ordered on this patient: None  ____________________________________________   PROCEDURES  Procedure(s) performed: none  Critical Care performed: none  ____________________________________________   INITIAL IMPRESSION / ASSESSMENT AND PLAN / ED COURSE  Pertinent labs & imaging results that were available during my care of the patient were reviewed by me and considered in my medical decision making (see chart for details).  I discussed the case with Dr. Pryor Ochoa of ENT. He will admit the patient and will likely take him to the OR today. He has asked for a medicine consultation to evaluate for possible bleeding disorder.  ----------------------------------------- 7:04 PM on 10/04/2015 -----------------------------------------  Dr. Pryor Ochoa in to see the patient  ____________________________________________   FINAL CLINICAL IMPRESSION(S) / ED DIAGNOSES  Final diagnoses:  Post-tonsillectomy hemorrhage, subsequent encounter     Lavonia Drafts, MD 10/04/15 1904

## 2015-10-04 NOTE — Progress Notes (Signed)
Amy RN- pacu RN received report, [facility] ED RN entered in Error-BR

## 2015-10-04 NOTE — Transfer of Care (Signed)
Immediate Anesthesia Transfer of Care Note  Patient: Ronnie Smith  Procedure(s) Performed: Procedure(s): control TONSILLECTOMY hemorrage (N/A)  Patient Location: PACU  Anesthesia Type:General  Level of Consciousness: sedated and patient cooperative  Airway & Oxygen Therapy: Patient Spontanous Breathing and Patient connected to face mask oxygen  Post-op Assessment: Report given to RN and Post -op Vital signs reviewed and stable  Post vital signs: Reviewed and stable  Last Vitals:  Filed Vitals:   10/04/15 0232  BP: 153/112  Temp: 38.1 C  Resp: 18    Complications: No apparent anesthesia complications

## 2015-10-04 NOTE — Anesthesia Postprocedure Evaluation (Signed)
  Anesthesia Post-op Note  Patient: Ronnie Smith  Procedure(s) Performed: Procedure(s):  control of TONSILLECTOMY hemorrage  (N/A)  Anesthesia type:General ETT, Cricoid Pressure, Rapid Sequence  Patient location: PACU  Post pain: Pain level controlled  Post assessment: Post-op Vital signs reviewed, Patient's Cardiovascular Status Stable, Respiratory Function Stable, Patent Airway and No signs of Nausea or vomiting  Post vital signs: Reviewed and stable  Last Vitals:  Filed Vitals:   10/04/15 2325  BP:   Pulse:   Temp: 37.3 C  Resp:     Level of consciousness: awake, alert  and patient cooperative  Complications: No apparent anesthesia complications

## 2015-10-04 NOTE — Consult Note (Signed)
..   Maximos, Zayas [id number] [birth year] Lavonia Drafts, MD  Reason for Consult: post-tonsillectomy bleeding  HPI: 19 y.o. Male s/p tonsillectomy on 10/02/2015.  Developed bleeding on 10/03/2015 and underwent control of post-tonsillar hemorrhage by Dr. Richardson Landry.  Uneventful post-operative course and was discharged today at noon.  Per dad and patient he ate ice cream, took medications, and then began to bleed again around 3p.m.  He reports gagging and active bleeding and headache.  Denies any NSAID usage, straining, or other issues.  Denies any previous history of easy bleeding.  Allergies:  Allergies  Allergen Reactions  . Doxycycline Rash    ROS: Review of systems normal other than 12 systems except per HPI.  PMH:  Past Medical History  Diagnosis Date  . Colitis   . Tonsillitis   . Complication of anesthesia     Prolonged Emergence    FH: No family history on file.  SH:  Social History   Social History  . Marital Status: Single    Spouse Name: N/A  . Number of Children: N/A  . Years of Education: N/A   Occupational History  . Not on file.   Social History Main Topics  . Smoking status: Never Smoker   . Smokeless tobacco: Never Used  . Alcohol Use: No  . Drug Use: No  . Sexual Activity: Not on file   Other Topics Concern  . Not on file   Social History Narrative    PSH:  Past Surgical History  Procedure Laterality Date  . Incision and drainage perirectal abscess N/A 04/19/2015    Procedure: IRRIGATION AND DEBRIDEMENT PERIRECTAL ABSCESS;  Surgeon: Florene Glen, MD;  Location: [facility] ORS;  Service: General;  Laterality: N/A;  . Incision and drainage perirectal abscess  12-2014  . Tonsillectomy and adenoidectomy N/A 10/02/2015    Procedure: TONSILLECTOMY AND ADENOIDECTOMY;  Surgeon: Beverly Gust, MD;  Location: [facility] ORS;  Service: ENT;  Laterality: N/A;    Physical  Exam:  GEN-  CN 2-12 grossly intact and symmetric. EARS- external ears WNL OC/OP-  bilateral tonsil fossa with blood clot and active bleeding EXT- Skin warm and dry.  NOSE- Nasal cavity without polyps or purulence. NECK- Neck supple with no masses or lesions. No lymphadenopathy palpated.   A/P: Post-tonsillectomy hemorrhage now with second bleed in 24 hours, 48 hours after surgery  Plan:  Reviewed BP records and CBC.  Will have medicine evaluate for assistance for possible cause of bleeding.  To OR emergently for control of post-tonsillectomy hemorrhage.  Will consider Heme-Onc consult as well depending on bloodwork results from medicine workup.  Anticipate several night stay due to recurrent bleeding and multiple returns to ED/OR.   Jaelyne Deeg 10/04/2015 7:17 PM

## 2015-10-04 NOTE — Anesthesia Preprocedure Evaluation (Signed)
Anesthesia Evaluation  Patient identified by MRN, date of birth, ID band Patient awake    Reviewed: Allergy & Precautions, H&P , NPO status , Patient's Chart, lab work & pertinent test results  History of Anesthesia Complications (+) PROLONGED EMERGENCE, Emergence Delirium and history of anesthetic complications  Airway Mallampati: II  TM Distance: >3 FB Neck ROM: full    Dental no notable dental hx. (+) Teeth Intact   Pulmonary sleep apnea ,    Pulmonary exam normal breath sounds clear to auscultation       Cardiovascular Exercise Tolerance: Good (-) angina(-) Past MI and (-) DOE negative cardio ROS Normal cardiovascular exam Rhythm:regular Rate:Normal     Neuro/Psych negative neurological ROS  negative psych ROS   GI/Hepatic negative GI ROS, Neg liver ROS,   Endo/Other  Morbid obesity  Renal/GU negative Renal ROS  negative genitourinary   Musculoskeletal   Abdominal   Peds  Hematology negative hematology ROS (+)   Anesthesia Other Findings Past Medical History:   Colitis                                                      Tonsillitis                                                  Complication of anesthesia                                     Comment:Prolonged Emergence  Past Surgical History:   INCISION AND DRAINAGE PERIRECTAL ABSCESS        N/A 04/19/2015      Comment:Procedure: IRRIGATION AND DEBRIDEMENT               PERIRECTAL ABSCESS;  Surgeon: Florene Glen,              MD;  Location: [facility] ORS;  Service: General;                Laterality: N/A;   INCISION AND DRAINAGE PERIRECTAL ABSCESS         12-2014       TONSILLECTOMY AND ADENOIDECTOMY                 N/A 10/02/2015     Comment:Procedure: TONSILLECTOMY AND ADENOIDECTOMY;                Surgeon: Beverly Gust, MD;  Location: [facility]               ORS;  Service: ENT;  Laterality: N/A;  BMI    Body Mass Index   40.54 kg/m 2    post  tonsillectomy hemorrhage  Reproductive/Obstetrics negative OB ROS                             Anesthesia Physical  Anesthesia Plan  ASA: III and emergent  Anesthesia Plan: General ETT, Cricoid Pressure and Rapid Sequence   Post-op Pain Management:    Induction: Intravenous  Airway Management Planned:   Additional Equipment:   Intra-op Plan:   Post-operative Plan:  Informed Consent: I have reviewed the patients History and Physical, chart, labs and discussed the procedure including the risks, benefits and alternatives for the proposed anesthesia with the patient or authorized representative who has indicated his/her understanding and acceptance.     Plan Discussed with: Anesthesiologist, CRNA and Surgeon  Anesthesia Plan Comments:         Anesthesia Quick Evaluation

## 2015-10-04 NOTE — Transfer of Care (Signed)
Immediate Anesthesia Transfer of Care Note  Patient: Ronnie Smith  Procedure(s) Performed: Procedure(s):  control of TONSILLECTOMY hemorrage  (N/A)  Patient Location: PACU  Anesthesia Type:General  Level of Consciousness: sedated  Airway & Oxygen Therapy: Patient Spontanous Breathing and Patient connected to face mask oxygen  Post-op Assessment: Report given to RN and Post -op Vital signs reviewed and stable  Post vital signs: Reviewed and stable  Last Vitals:  Filed Vitals:   10/04/15 2325  BP:   Pulse:   Temp: 37.3 C  Resp:     Complications: No apparent anesthesia complications

## 2015-10-04 NOTE — Anesthesia Preprocedure Evaluation (Signed)
Anesthesia Evaluation  Patient identified by MRN, date of birth, ID band Patient awake    Reviewed: Allergy & Precautions, H&P , NPO status , Patient's Chart, lab work & pertinent test results  History of Anesthesia Complications (+) PROLONGED EMERGENCE, Emergence Delirium and history of anesthetic complications  Airway Mallampati: II  TM Distance: >3 FB Neck ROM: full    Dental no notable dental hx. (+) Teeth Intact   Pulmonary sleep apnea ,    Pulmonary exam normal breath sounds clear to auscultation       Cardiovascular Exercise Tolerance: Good (-) angina(-) Past MI and (-) DOE negative cardio ROS Normal cardiovascular exam Rhythm:regular Rate:Normal     Neuro/Psych negative neurological ROS  negative psych ROS   GI/Hepatic negative GI ROS, Neg liver ROS,   Endo/Other  Morbid obesity  Renal/GU negative Renal ROS  negative genitourinary   Musculoskeletal   Abdominal   Peds  Hematology negative hematology ROS (+)   Anesthesia Other Findings Past Medical History:   Colitis                                                      Tonsillitis                                                  Complication of anesthesia                                     Comment:Prolonged Emergence  Past Surgical History:   INCISION AND DRAINAGE PERIRECTAL ABSCESS        N/A 04/19/2015      Comment:Procedure: IRRIGATION AND DEBRIDEMENT               PERIRECTAL ABSCESS;  Surgeon: Florene Glen,              MD;  Location: [facility] ORS;  Service: General;                Laterality: N/A;   INCISION AND DRAINAGE PERIRECTAL ABSCESS         12-2014       TONSILLECTOMY AND ADENOIDECTOMY                 N/A 10/02/2015     Comment:Procedure: TONSILLECTOMY AND ADENOIDECTOMY;                Surgeon: Beverly Gust, MD;  Location: [facility]               ORS;  Service: ENT;  Laterality: N/A;  BMI    Body Mass Index   40.54 kg/m 2    post  tonsillectomy hemorrhage  Reproductive/Obstetrics negative OB ROS                             Anesthesia Physical Anesthesia Plan  ASA: IV and emergent  Anesthesia Plan: General ETT, Cricoid Pressure and Rapid Sequence   Post-op Pain Management:    Induction:   Airway Management Planned:   Additional Equipment:   Intra-op Plan:   Post-operative Plan:  Informed Consent: I have reviewed the patients History and Physical, chart, labs and discussed the procedure including the risks, benefits and alternatives for the proposed anesthesia with the patient or authorized representative who has indicated his/her understanding and acceptance.   Dental Advisory Given  Plan Discussed with: Anesthesiologist, CRNA and Surgeon  Anesthesia Plan Comments:         Anesthesia Quick Evaluation

## 2015-10-04 NOTE — ED Notes (Signed)
Pt had tonsils removed this morning, was D/C at noon. Bleeding began at 3pm and has not stopped

## 2015-10-04 NOTE — Op Note (Signed)
10/04/2015  3:47 AM    Ronnie Smith  [id number]   Pre-Op Diagnosis:  post tonsillectomy bleed  Post-op Diagnosis: post tonsillectomy hemorrhage  Procedure: Control of post tonsillectomy hemorrhage  Surgeon:  Riley Nearing  Anesthesia:  General endotracheal  EBL:  Less than 25 cc  Complications:  None  Findings: There was bleeding noted from both tonsillar fossae, involving the mid pole on the left, as well as the superior and mid pole on the right  Procedure: The patient was taken to the Operating Room and placed in the supine position.  After induction of general endotracheal anesthesia, the table was turned 90 degrees and the patient was draped in the usual fashion for with the eyes protected.  A mouth gag was inserted into the oral cavity to open the mouth, and examination of the oropharynx showed quite a bit of blood clot in the pharynx. This was suctioned out and bleeding was identified from the tonsillar fossa bilaterally. The Bovie was used to obtain hemostasis, cauterizing the area involving left mid pole of the tonsil as well as bleeding from the right tonsillar fossa involving the mid pole and superior pole. There was some oozing from the left mid pole area that did not initially respond to cautery and was controlled by holding pressure with an Afrin moistened pack. An OG tube was passed and the stomach evacuated of excessive blood and secretions. The throat was irrigated and reinspected for bleeding which appeared to be under good control. The mouth gag was released, reinspecting the pharynx for bleeding and then removed with bleeding well controlled .  The patient was then returned to the anesthesiologist for awakening, and was taken to the Recovery Room in stable condition.  Cultures:  None.  Specimens:  none  Disposition:   PACU then to floor for observation  Plan: To floor to monitor for bleeding and adequate PO intake prior to decision for discharge home.  Supplemental IV fluids.  Riley Nearing 10/04/2015 3:47 AM

## 2015-10-04 NOTE — Progress Notes (Signed)
Patient ID: Ronnie Smith, male   DOB: Jun 04, 1996, 19 y.o.   MRN: [id number] Patient kept for observation and more extended recovery, given his slow recovery from anesthesia. Dr. Tami Ribas has rounded on the patient this AM around 9am, and found him sleepy but easily arousable, and no evidence of bleeding, so he felt the patient was appropriate for discharge home. Will d/c home to continue the postop meds originally prescribed. Follow-up as scheduled with Dr. Tami Ribas.

## 2015-10-04 NOTE — ED Notes (Signed)
Dr. Virgia Land was called by Dr. Owens Shark in reference to the Pt's symptoms; Dr. Virgia Land is in rout to consult.

## 2015-10-04 NOTE — Anesthesia Postprocedure Evaluation (Signed)
  Anesthesia Post-op Note  Patient: Ronnie Smith  Procedure(s) Performed: Procedure(s): control TONSILLECTOMY hemorrage (N/A)  Anesthesia type:General ETT, Cricoid Pressure, Rapid Sequence  Patient location: PACU  Post pain: Pain level controlled  Post assessment: Post-op Vital signs reviewed, Patient's Cardiovascular Status Stable, Respiratory Function Stable, Patent Airway and No signs of Nausea or vomiting  Post vital signs: Reviewed and stable  Last Vitals:  Filed Vitals:   10/04/15 0733  BP: 120/67  Pulse: 78  Temp: 36.7 C  Resp: 14    Level of consciousness: awake, alert  and patient cooperative  Complications: No apparent anesthesia complications

## 2015-10-04 NOTE — Anesthesia Procedure Notes (Signed)
Procedure Name: Intubation Date/Time: 10/04/2015 10:30 PM Performed by: Nelda Marseille Pre-anesthesia Checklist: Patient identified, Patient being monitored, Timeout performed, Emergency Drugs available and Suction available Patient Re-evaluated:Patient Re-evaluated prior to inductionOxygen Delivery Method: Circle system utilized Preoxygenation: Pre-oxygenation with 100% oxygen Intubation Type: IV induction Ventilation: Mask ventilation without difficulty Laryngoscope Size: Mac and 3 Grade View: Grade II Tube type: Oral Tube size: 7.5 mm Number of attempts: 1 Airway Equipment and Method: Stylet Placement Confirmation: ETT inserted through vocal cords under direct vision,  positive ETCO2 and breath sounds checked- equal and bilateral Secured at: 21 cm Tube secured with: Tape Dental Injury: Teeth and Oropharynx as per pre-operative assessment

## 2015-10-05 ENCOUNTER — Encounter: Payer: Self-pay | Admitting: Otolaryngology

## 2015-10-05 DIAGNOSIS — J95830 Postprocedural hemorrhage and hematoma of a respiratory system organ or structure following a respiratory system procedure: Secondary | ICD-10-CM | POA: Diagnosis present

## 2015-10-05 DIAGNOSIS — I10 Essential (primary) hypertension: Secondary | ICD-10-CM | POA: Diagnosis present

## 2015-10-05 DIAGNOSIS — R070 Pain in throat: Secondary | ICD-10-CM

## 2015-10-05 DIAGNOSIS — R041 Hemorrhage from throat: Secondary | ICD-10-CM

## 2015-10-05 DIAGNOSIS — Z9089 Acquired absence of other organs: Secondary | ICD-10-CM | POA: Diagnosis not present

## 2015-10-05 HISTORY — DX: Essential (primary) hypertension: I10

## 2015-10-05 LAB — CBC WITH DIFFERENTIAL/PLATELET
BASOS ABS: 0 10*3/uL (ref 0–0.1)
BASOS PCT: 0 %
EOS ABS: 0 10*3/uL (ref 0–0.7)
EOS PCT: 0 %
HCT: 39.2 % — ABNORMAL LOW (ref 40.0–52.0)
Hemoglobin: 12.4 g/dL — ABNORMAL LOW (ref 13.0–18.0)
Lymphocytes Relative: 7 %
Lymphs Abs: 1.5 10*3/uL (ref 1.0–3.6)
MCH: 26.4 pg (ref 26.0–34.0)
MCHC: 31.7 g/dL — ABNORMAL LOW (ref 32.0–36.0)
MCV: 83.3 fL (ref 80.0–100.0)
MONO ABS: 1.5 10*3/uL — AB (ref 0.2–1.0)
Monocytes Relative: 7 %
Neutro Abs: 17.8 10*3/uL — ABNORMAL HIGH (ref 1.4–6.5)
Neutrophils Relative %: 86 %
PLATELETS: 459 10*3/uL — AB (ref 150–440)
RBC: 4.71 MIL/uL (ref 4.40–5.90)
RDW: 14.1 % (ref 11.5–14.5)
WBC: 20.8 10*3/uL — ABNORMAL HIGH (ref 3.8–10.6)

## 2015-10-05 LAB — PLATELET FUNCTION ASSAY
COLLAGEN / ADP: 73 s (ref 0–118)
Collagen / Epinephrine: 95 seconds (ref 0–193)

## 2015-10-05 MED ORDER — PREDNISONE 10 MG (21) PO TBPK
10.0000 mg | ORAL_TABLET | Freq: Three times a day (TID) | ORAL | Status: AC
  Administered 2015-10-06: 10 mg via ORAL

## 2015-10-05 MED ORDER — PREDNISONE 10 MG (21) PO TBPK
20.0000 mg | ORAL_TABLET | Freq: Every morning | ORAL | Status: AC
  Administered 2015-10-05: 20 mg via ORAL
  Filled 2015-10-05: qty 21

## 2015-10-05 MED ORDER — PREDNISONE 10 MG (21) PO TBPK
10.0000 mg | ORAL_TABLET | Freq: Four times a day (QID) | ORAL | Status: DC
  Administered 2015-10-07: 10 mg via ORAL

## 2015-10-05 MED ORDER — DOCUSATE SODIUM 100 MG PO CAPS
100.0000 mg | ORAL_CAPSULE | Freq: Two times a day (BID) | ORAL | Status: DC
  Filled 2015-10-05 (×3): qty 1

## 2015-10-05 MED ORDER — DEXTROSE-NACL 5-0.45 % IV SOLN
INTRAVENOUS | Status: DC
  Administered 2015-10-05: 01:00:00 via INTRAVENOUS
  Administered 2015-10-05: 1 mL via INTRAVENOUS

## 2015-10-05 MED ORDER — HYDRALAZINE HCL 20 MG/ML IJ SOLN
10.0000 mg | INTRAMUSCULAR | Status: DC | PRN
Start: 2015-10-05 — End: 2015-10-07

## 2015-10-05 MED ORDER — PREDNISONE 10 MG (21) PO TBPK
20.0000 mg | ORAL_TABLET | Freq: Every evening | ORAL | Status: AC
  Administered 2015-10-05: 20 mg via ORAL

## 2015-10-05 MED ORDER — PREDNISONE 10 MG (21) PO TBPK
10.0000 mg | ORAL_TABLET | ORAL | Status: AC
  Administered 2015-10-05: 10 mg via ORAL

## 2015-10-05 MED ORDER — ONDANSETRON HCL 4 MG PO TABS: 4.0000 mg | ORAL_TABLET | ORAL | Status: DC | PRN

## 2015-10-05 MED ORDER — PREDNISONE 10 MG (21) PO TBPK: 20.0000 mg | ORAL_TABLET | Freq: Every evening | ORAL | Status: AC

## 2015-10-05 MED ORDER — CEFAZOLIN SODIUM 1-5 GM-% IV SOLN
1.0000 g | Freq: Three times a day (TID) | INTRAVENOUS | Status: DC
  Administered 2015-10-05 (×4): 1 g via INTRAVENOUS
  Filled 2015-10-05 (×7): qty 50

## 2015-10-05 MED ORDER — MORPHINE SULFATE (PF) 2 MG/ML IV SOLN
2.0000 mg | INTRAVENOUS | Status: DC | PRN
  Administered 2015-10-05 – 2015-10-06 (×11): 4 mg via INTRAVENOUS
  Administered 2015-10-06 – 2015-10-07 (×4): 2 mg via INTRAVENOUS
  Filled 2015-10-05 (×2): qty 1
  Filled 2015-10-05: qty 2
  Filled 2015-10-05: qty 1
  Filled 2015-10-05 (×6): qty 2
  Filled 2015-10-05: qty 1
  Filled 2015-10-05: qty 2
  Filled 2015-10-05: qty 1
  Filled 2015-10-05 (×3): qty 2

## 2015-10-05 MED ORDER — OXYCODONE HCL 5 MG/5ML PO SOLN
5.0000 mg | ORAL | Status: DC | PRN
  Filled 2015-10-05: qty 5

## 2015-10-05 MED ORDER — ONDANSETRON HCL 4 MG/2ML IJ SOLN: 4.0000 mg | INTRAMUSCULAR | Status: DC | PRN

## 2015-10-05 NOTE — Progress Notes (Signed)
[place] [facility] [place] NAME: Ronnie Smith    MR#:  [id number]  DATE OF BIRTH:  16-Mar-1996  SUBJECTIVE:  CHIEF COMPLAINT:   Chief Complaint  Patient presents with  . Post-op Problem   -Patient is  status post tonsillectomy and admitted for increased post-tonsillectomy bleeding. -Complains of significant throat pain from the surgery, also has weakness in both lower extremities.  REVIEW OF SYSTEMS:  Review of Systems  Constitutional: Negative for fever and chills.  HENT: Positive for sore throat. Negative for ear discharge, ear pain and nosebleeds.   Eyes: Negative for blurred vision, double vision and photophobia.  Respiratory: Negative for cough, shortness of breath and wheezing.   Cardiovascular: Negative for chest pain, palpitations and leg swelling.  Gastrointestinal: Negative for nausea, vomiting, abdominal pain, diarrhea and constipation.  Genitourinary: Negative for dysuria, urgency and frequency.  Musculoskeletal: Positive for neck pain. Negative for myalgias.  Skin: Negative for rash.  Neurological: Positive for weakness. Negative for dizziness, tingling, tremors, speech change, focal weakness, seizures and headaches.  Psychiatric/Behavioral: Negative for depression.    DRUG ALLERGIES:   Allergies  Allergen Reactions  . Doxycycline Rash    VITALS:  Blood pressure 98/47, pulse 86, temperature 98.2 F (36.8 C), temperature source Oral, resp. rate 18, height 6\' 2"  (1.88 m), weight 142.067 kg (313 lb 3.2 oz), SpO2 99 %.  PHYSICAL EXAMINATION:  Physical Exam  GENERAL:  19 y.o.-year-old obese patient lying in the bed with no acute distress.  EYES: Pupils equal, round, reactive to light and accommodation. No scleral icterus. Extraocular muscles intact.  HEENT: Head atraumatic, normocephalic. nasopharynx clear. Oropharynx is clear except in the tonsillar region where there is postoperative changes noted, sutures in place, no  active bleeding. NECK:  Supple, no jugular venous distention. No thyroid enlargement, no tenderness.  No tender lymphadenopathy LUNGS: Normal breath sounds bilaterally, no wheezing, rales,rhonchi or crepitation. No use of accessory muscles of respiration.  CARDIOVASCULAR: S1, S2 normal. No murmurs, rubs, or gallops.  ABDOMEN: Soft, nontender, nondistended. Bowel sounds present. No organomegaly or mass.  EXTREMITIES: No pedal edema, cyanosis, or clubbing.  NEUROLOGIC: Cranial nerves II through XII are intact. Muscle strength 5/5 in upper extremities and 4 x 5 in both lower extremities. Sensation intact. Gait not checked.  PSYCHIATRIC: The patient is alert and oriented x 3.  SKIN: No obvious rash, lesion, or ulcer.    LABORATORY PANEL:   CBC  Recent Labs Lab 10/05/15 0505  WBC 20.8*  HGB 12.4*  HCT 39.2*  PLT 459*   ------------------------------------------------------------------------------------------------------------------  Chemistries   Recent Labs Lab 10/04/15 1842  NA 137  K 3.9  CL 102  CO2 24  GLUCOSE 103*  BUN 15  CREATININE 0.99  CALCIUM 9.3  AST 20  ALT 25  ALKPHOS 79  BILITOT 0.7   ------------------------------------------------------------------------------------------------------------------  Cardiac Enzymes No results for input(s): TROPONINI in the last 168 hours. ------------------------------------------------------------------------------------------------------------------  RADIOLOGY:  No results found.  EKG:   Orders placed or performed during the hospital encounter of 10/04/15  . ED EKG  . ED EKG    ASSESSMENT AND PLAN:   19 year old obese male with no significant past medical history admitted to the hospital for prolonged post tonsillar bleeding.  #1 prolonged bleeding-patient had tonsillectomy and 10/02/2015, had to stay in the hospital until 10/03/2015 for worsening bleeding episode. He got discharged and came back the same day  for worsening bleeding again. -No active bleeding episode. Patient had prior  surgical procedure-debridement of a perirectal abscess done in the past and patient denies any prolonged bleeding at the time. - no noticeable bleeding after minor cuts - INR, PT normal, aptt slightly prolonged - normal plt count, vWF test pending, none of the other factor test have been ordered -Negative for any bleeding. -Hematology consulted. Labs can be followed up as an outpatient. Patient can be discharged if his bleeding is stable.  #2 throat pain-post tonsillectomy - Management per ENT. -Remains on liquid diet. Continue pain medications - on steroids and IV ancef  #3 HTN- from pain, improved now - monitor, IV hydralazine prn  #4 Generalized weakness- encourage ambulation  #5 Leukocytosis- from steroids and recent infection- monitor  All the records are reviewed and case discussed with Care Management/Social Workerr. Management plans discussed with the patient, family and they are in agreement.  CODE STATUS: Full code  TOTAL TIME TAKING CARE OF THIS PATIENT: 36 minutes.   POSSIBLE D/C IN 1-2 DAYS, DEPENDING ON CLINICAL CONDITION.   Mariamawit Depaoli M.D on 10/05/2015 at 12:12 PM  Between 7am to 6pm - Pager - [phone]  After 6pm go to [email or web address] - password [email or web address] [facility]  [place] [facility]  Office  [phone]  CC: Primary care physician; No PCP Per Patient

## 2015-10-05 NOTE — Progress Notes (Signed)
.. 10/05/2015 7:47 AM  Ronnie Smith [id number]  Post-Op Day 1,1,3    Temp:  [98.9 F (37.2 C)-99.2 F (37.3 C)] 99 F (37.2 C) (10/28 0500) Pulse Rate:  [100-136] 100 (10/28 0500) Resp:  [16-20] 18 (10/28 0500) BP: (128-149)/(66-99) 128/70 mmHg (10/28 0500) SpO2:  [96 %-99 %] 98 % (10/28 0500) Weight:  [142.067 kg (313 lb 3.2 oz)-142.883 kg (315 lb)] 142.067 kg (313 lb 3.2 oz) (10/28 0031),     Intake/Output Summary (Last 24 hours) at 10/05/15 0747 Last data filed at 10/05/15 0500  Gross per 24 hour  Intake   2208 ml  Output    600 ml  Net   1608 ml    Results for orders placed or performed during the hospital encounter of 10/04/15 (from the past 24 hour(s))  CBC     Status: Abnormal   Collection Time: 10/04/15  6:42 PM  Result Value Ref Range   WBC 24.8 (H) 3.8 - 10.6 K/uL   RBC 5.33 4.40 - 5.90 MIL/uL   Hemoglobin 14.4 13.0 - 18.0 g/dL   HCT 44.1 40.0 - 52.0 %   MCV 82.8 80.0 - 100.0 fL   MCH 27.0 26.0 - 34.0 pg   MCHC 32.6 32.0 - 36.0 g/dL   RDW 14.2 11.5 - 14.5 %   Platelets 493 (H) 150 - 440 K/uL  Comprehensive metabolic panel     Status: Abnormal   Collection Time: 10/04/15  6:42 PM  Result Value Ref Range   Sodium 137 135 - 145 mmol/L   Potassium 3.9 3.5 - 5.1 mmol/L   Chloride 102 101 - 111 mmol/L   CO2 24 22 - 32 mmol/L   Glucose, Bld 103 (H) 65 - 99 mg/dL   BUN 15 6 - 20 mg/dL   Creatinine, Ser 0.99 0.61 - 1.24 mg/dL   Calcium 9.3 8.9 - 10.3 mg/dL   Total Protein 8.4 (H) 6.5 - 8.1 g/dL   Albumin 4.4 3.5 - 5.0 g/dL   AST 20 15 - 41 U/L   ALT 25 17 - 63 U/L   Alkaline Phosphatase 79 38 - 126 U/L   Total Bilirubin 0.7 0.3 - 1.2 mg/dL   GFR calc non Af Amer >60 >60 mL/min   GFR calc Af Amer >60 >60 mL/min   Anion gap 11 5 - 15  APTT     Status: Abnormal   Collection Time: 10/04/15  6:42 PM  Result Value Ref Range   aPTT 37 (H) 24 - 36 seconds  Protime-INR     Status: None   Collection Time: 10/04/15  6:42 PM  Result Value Ref Range   Prothrombin Time 15.0 11.4 - 15.0 seconds   INR 1.16   Type and screen [place]     Status: None   Collection Time: 10/04/15  7:04 PM  Result Value Ref Range   ABO/RH(D) O POS    Antibody Screen NEG    Sample Expiration 10/07/2015   ABO/Rh     Status: None   Collection Time: 10/04/15  7:05 PM  Result Value Ref Range   ABO/RH(D) O POS   CBC with Differential/Platelet     Status: Abnormal   Collection Time: 10/05/15  5:05 AM  Result Value Ref Range   WBC 20.8 (H) 3.8 - 10.6 K/uL   RBC 4.71 4.40 - 5.90 MIL/uL   Hemoglobin 12.4 (L) 13.0 - 18.0 g/dL   HCT 39.2 (L) 40.0 - 52.0 %  MCV 83.3 80.0 - 100.0 fL   MCH 26.4 26.0 - 34.0 pg   MCHC 31.7 (L) 32.0 - 36.0 g/dL   RDW 14.1 11.5 - 14.5 %   Platelets 459 (H) 150 - 440 K/uL   Neutrophils Relative % 86 %   Neutro Abs 17.8 (H) 1.4 - 6.5 K/uL   Lymphocytes Relative 7 %   Lymphs Abs 1.5 1.0 - 3.6 K/uL   Monocytes Relative 7 %   Monocytes Absolute 1.5 (H) 0.2 - 1.0 K/uL   Eosinophils Relative 0 %   Eosinophils Absolute 0.0 0 - 0.7 K/uL   Basophils Relative 0 %   Basophils Absolute 0.0 0 - 0.1 K/uL    SUBJECTIVE:  No acute events since surgery.  Reports pain controlled with new medications this a.m.  No further bleeding.  Improved BP this a.m.  Continued low grade fever.  OBJECTIVE:  GEN- resting comfortably Nose- clear anteriorly OC/OP- tonsillar fossas clear with no bleeding Neck- supple  IMPRESSION:  S/p control of Post-tonsillectomy hemorrhage X2  PLAN:  1)  Appreciate Medicine assistance, defer regarding BP/HR 2)  Slightly elevated PTT, will have Heme-Onc eval to see if any further recs beyond Medicine 3)  Continue IVF, slight drop in Hgb.  Will recheck in a.m. 4)  Fever, will continue Ancef  Saturnino Liew 10/05/2015, 7:47 AM

## 2015-10-05 NOTE — Consult Note (Signed)
[place]  [phone] [phone]  ID: Ronnie Smith OB: 25-Jan-1996  MR#: [id number]  LGX#:[id number]  Patient Care Team: No Pcp Per Patient as PCP - General (General Practice)  CHIEF COMPLAINT:  Chief Complaint  Patient presents with  . Post-op Problem    INTERVAL HISTORY: Patient is a 19 year old male who was admitted to the hospital for excessive bleeding after tonsillectomy. Currently, he is lethargic. His only complaint is of increased throat pain. He has mild weakness and fatigue. He has no neurologic complaints. He denies any fevers. He denies any chest pain or shortness of breath. He denies any nausea, vomiting, consultation, or diarrhea. He denies any personal or family history of excessive bleeding. Patient offers no further specific complaints.  REVIEW OF SYSTEMS:   Review of Systems  Constitutional: Positive for malaise/fatigue. Negative for fever.  HENT: Positive for sore throat. Negative for nosebleeds.   Respiratory: Negative.   Cardiovascular: Negative.   Gastrointestinal: Negative.  Negative for blood in stool and melena.  Genitourinary: Negative for hematuria.  Musculoskeletal: Negative.   Neurological: Positive for weakness.  Endo/Heme/Allergies: Bruises/bleeds easily.    As per HPI. Otherwise, a complete review of systems is negatve.  PAST MEDICAL HISTORY: Past Medical History  Diagnosis Date  . Colitis   . Tonsillitis   . Complication of anesthesia     Prolonged Emergence    PAST SURGICAL HISTORY: Past Surgical History  Procedure Laterality Date  . Incision and drainage perirectal abscess N/A 04/19/2015    Procedure: IRRIGATION AND DEBRIDEMENT PERIRECTAL ABSCESS;  Surgeon: Florene Glen, MD;  Location: [facility] ORS;  Service: General;  Laterality: N/A;  . Incision and drainage perirectal abscess  12-2014  . Tonsillectomy and adenoidectomy N/A 10/02/2015    Procedure: TONSILLECTOMY AND ADENOIDECTOMY;  Surgeon:  Beverly Gust, MD;  Location: [facility] ORS;  Service: ENT;  Laterality: N/A;  . Tonsillectomy N/A 10/04/2015    Procedure:  control of TONSILLECTOMY hemorrage ;  Surgeon: Carloyn Manner, MD;  Location: [facility] ORS;  Service: ENT;  Laterality: N/A;  . Tonsillectomy and adenoidectomy N/A 10/04/2015    Procedure: control TONSILLECTOMY hemorrage;  Surgeon: Clyde Canterbury, MD;  Location: [facility] ORS;  Service: ENT;  Laterality: N/A;    FAMILY HISTORY History reviewed. No pertinent family history.     ADVANCED DIRECTIVES:    HEALTH MAINTENANCE: Social History  Substance Use Topics  . Smoking status: Never Smoker   . Smokeless tobacco: Never Used  . Alcohol Use: No     Colonoscopy:  PAP:  Bone density:  Lipid panel:  Allergies  Allergen Reactions  . Doxycycline Rash    Current Facility-Administered Medications  Medication Dose Route Frequency Provider Last Rate Last Dose  . ceFAZolin (ANCEF) IVPB 1 g/50 mL premix  1 g Intravenous 3 times per day Carloyn Manner, MD   1 g at 10/05/15 1541  . dextrose 5 %-0.45 % sodium chloride infusion   Intravenous Continuous Carloyn Manner, MD 125 mL/hr at 10/05/15 1020 1 mL at 10/05/15 1020  . docusate sodium (COLACE) capsule 100 mg  100 mg Oral BID Carloyn Manner, MD   100 mg at 10/05/15 0049  . hydrALAZINE (APRESOLINE) injection 10 mg  10 mg Intravenous Q4H PRN Lance Coon, MD      . morphine 2 MG/ML injection 2-4 mg  2-4 mg Intravenous Q2H PRN Carloyn Manner, MD   4 mg at 10/05/15 1316  . ondansetron (ZOFRAN) tablet 4 mg  4 mg Oral Q4H PRN Jeannie Fend  Vaught, MD       Or  . ondansetron (ZOFRAN) injection 4 mg  4 mg Intravenous Q4H PRN Carloyn Manner, MD      . oxyCODONE (ROXICODONE) 5 MG/5ML solution 5 mg  5 mg Oral Q4H PRN Carloyn Manner, MD      . predniSONE (STERAPRED UNI-PAK 21 TAB) tablet 10 mg  10 mg Oral PC supper Carloyn Manner, MD      . Derrill Memo ON 10/06/2015] predniSONE (STERAPRED UNI-PAK 21 TAB) tablet 10 mg  10 mg Oral 3 x  daily with food Carloyn Manner, MD      . Derrill Memo ON 10/07/2015] predniSONE (STERAPRED UNI-PAK 21 TAB) tablet 10 mg  10 mg Oral 4X daily taper Carloyn Manner, MD      . predniSONE (STERAPRED UNI-PAK 21 TAB) tablet 20 mg  20 mg Oral Nightly Carloyn Manner, MD      . predniSONE (STERAPRED UNI-PAK 21 TAB) tablet 20 mg  20 mg Oral Nightly Carloyn Manner, MD        OBJECTIVE: Filed Vitals:   10/05/15 1309  BP: 117/67  Pulse: 85  Temp: 99.1 F (37.3 C)  Resp: 18     Body mass index is 40.2 kg/(m^2).    ECOG FS:0 - Asymptomatic  General: Well-developed, well-nourished, no acute distress. Eyes: Pink conjunctiva, anicteric sclera. HEENT: Normocephalic, moist mucous membranes, clear oropharnyx. Lungs: Clear to auscultation bilaterally. Heart: Regular rate and rhythm. No rubs, murmurs, or gallops. Abdomen: Soft, nontender, nondistended. No organomegaly noted, normoactive bowel sounds. Musculoskeletal: No edema, cyanosis, or clubbing. Neuro: Lethargic, answering all questions appropriately. Cranial nerves grossly intact. Skin: No rashes or petechiae noted. Psych: Lethargic.  LAB RESULTS:  Lab Results  Component Value Date   NA 137 10/04/2015   K 3.9 10/04/2015   CL 102 10/04/2015   CO2 24 10/04/2015   GLUCOSE 103* 10/04/2015   BUN 15 10/04/2015   CREATININE 0.99 10/04/2015   CALCIUM 9.3 10/04/2015   PROT 8.4* 10/04/2015   ALBUMIN 4.4 10/04/2015   AST 20 10/04/2015   ALT 25 10/04/2015   ALKPHOS 79 10/04/2015   BILITOT 0.7 10/04/2015   GFRNONAA >60 10/04/2015   GFRAA >60 10/04/2015    Lab Results  Component Value Date   WBC 20.8* 10/05/2015   NEUTROABS 17.8* 10/05/2015   HGB 12.4* 10/05/2015   HCT 39.2* 10/05/2015   MCV 83.3 10/05/2015   PLT 459* 10/05/2015     STUDIES: No results found.  ASSESSMENT: Excessive bleeding with normal PT and PTT.  PLAN:    1. Excessive bleeding after tonsillectomy: Patient states he has had 2 other surgeries without bleeding.  He does not have any family history of bleeding problems either. Patient's with excessive bleeding and normal PT and PTT typically have either underlying platelet dysfunction or von Willebrand's disease. This patient's platelet count is normal and PFA is also normal.  von Willebrand's panel is pending.  If von Willebrand's panel also is normal, will have to consider additional testing for other rare platelet disorders. Given a normal INR, FFP would not be of help if excessive bleeding continues. Continue current treatment.  Appreciate consult, will follow.   Lloyd Huger, MD   10/05/2015 4:13 PM

## 2015-10-05 NOTE — Consult Note (Signed)
[place] at [place] NAME: Ronnie Smith    MR#:  [id number]  DATE OF BIRTH:  [birth year]  DATE OF ADMISSION:  10/04/2015  PRIMARY CARE PHYSICIAN: No PCP Per Patient   REQUESTING/REFERRING PHYSICIAN: Jeannie Fend, MD  CHIEF COMPLAINT:   Chief Complaint  Patient presents with  . Post-op Problem    HISTORY OF PRESENT ILLNESS:  Ronnie Smith  is a 19 y.o. male who presents with post tonsillectomy bleeding. Patient initially had tonsillectomy performed and has presented now for second time with significant post tonsillectomy bleeding. He was taken back to the OR by Dr. Jeannie Fend for hemostasis due to the same. He notably also has had significantly elevated blood pressure, which is questionably contributing to his bleeding. However, Dr.Creighton feels that his bleeding was significant enough to warrant a workup for potential bleeding disorder. Hospitalists were consulted for management of hypertension and initial workup of the patient's bleeding problem, though hematology will also likely be consulted.  PAST MEDICAL HISTORY:   Past Medical History  Diagnosis Date  . Colitis   . Tonsillitis   . Complication of anesthesia     Prolonged Emergence    PAST SURGICAL HISTOIRY:   Past Surgical History  Procedure Laterality Date  . Incision and drainage perirectal abscess N/A 04/19/2015    Procedure: IRRIGATION AND DEBRIDEMENT PERIRECTAL ABSCESS;  Surgeon: Florene Glen, MD;  Location: [facility] ORS;  Service: General;  Laterality: N/A;  . Incision and drainage perirectal abscess  12-2014  . Tonsillectomy and adenoidectomy N/A 10/02/2015    Procedure: TONSILLECTOMY AND ADENOIDECTOMY;  Surgeon: Beverly Gust, MD;  Location: [facility] ORS;  Service: ENT;  Laterality: N/A;    SOCIAL HISTORY:   Social History  Substance Use Topics  . Smoking status: Never Smoker   . Smokeless tobacco: Never Used  . Alcohol Use: No    FAMILY HISTORY:  History  reviewed. No pertinent family history.  DRUG ALLERGIES:   Allergies  Allergen Reactions  . Doxycycline Rash    REVIEW OF SYSTEMS:  Review of Systems  Constitutional: Negative for fever, chills, weight loss and malaise/fatigue.  HENT: Positive for sore throat. Negative for ear pain, hearing loss and tinnitus.        Post tonsillectomy bleeding  Eyes: Negative for blurred vision, double vision, pain and redness.  Respiratory: Negative for cough, hemoptysis and shortness of breath.   Cardiovascular: Negative for chest pain, palpitations, orthopnea and leg swelling.  Gastrointestinal: Negative for nausea, vomiting, abdominal pain, diarrhea and constipation.  Genitourinary: Negative for dysuria, frequency and hematuria.  Musculoskeletal: Negative for back pain, joint pain and neck pain.  Skin:       No acne, rash, or lesions  Neurological: Negative for dizziness, tremors, focal weakness and weakness.  Endo/Heme/Allergies: Negative for polydipsia. Does not bruise/bleed easily.  Psychiatric/Behavioral: Negative for depression. The patient is not nervous/anxious and does not have insomnia.     MEDICATIONS AT HOME:   Prior to Admission medications   Medication Sig Start Date End Date Taking? Authorizing Provider  HYDROcodone-acetaminophen (HYCET) 7.5-325 mg/15 ml solution Take 15 mLs by mouth every 4 (four) hours as needed for moderate pain. 10/02/15  Yes Beverly Gust, MD  ondansetron [facility]) 4 MG tablet Take 1-2 tabs by mouth every 8 hours as needed for nausea/vomiting 09/23/15  Yes Hinda Kehr, MD      VITAL SIGNS:   Filed Vitals:   10/04/15 2325 10/05/15 0009 10/05/15 0031 10/05/15 0107  BP:  130/67 139/78  Pulse:   119 114  Temp: 99.2 F (37.3 C) 99.1 F (37.3 C) 98.9 F (37.2 C) 99.1 F (37.3 C)  TempSrc:   Oral Oral  Resp:   16 18  Height:   6\' 2"  (1.88 m)   Weight:   142.067 kg (313 lb 3.2 oz)   SpO2:   98% 97%   Wt Readings from Last 3 Encounters:  10/05/15  142.067 kg (313 lb 3.2 oz)  10/04/15 134.628 kg (296 lb 12.8 oz)  10/03/15 143.337 kg (316 lb)    PHYSICAL EXAMINATION:  Physical Exam  Vitals reviewed. Constitutional: He is oriented to person, place, and time. He appears well-developed and well-nourished. No distress.  HENT:  Head: Normocephalic and atraumatic.  Postop tonsillectomy  Eyes: Conjunctivae and EOM are normal. Pupils are equal, round, and reactive to light. No scleral icterus.  Neck: Normal range of motion. Neck supple. No JVD present. No thyromegaly present.  Cardiovascular: Normal rate, regular rhythm and intact distal pulses.  Exam reveals no gallop and no friction rub.   No murmur heard. Respiratory: Effort normal and breath sounds normal. No respiratory distress. He has no wheezes. He has no rales.  GI: Soft. Bowel sounds are normal. He exhibits no distension. There is no tenderness.  Musculoskeletal: Normal range of motion. He exhibits no edema.  No arthritis, no gout  Lymphadenopathy:    He has no cervical adenopathy.  Neurological: He is alert and oriented to person, place, and time. No cranial nerve deficit.  No dysarthria, no aphasia  Skin: Skin is warm and dry. No rash noted. No erythema.  Psychiatric: He has a normal mood and affect. His behavior is normal. Judgment and thought content normal.     LABORATORY PANEL:   CBC  Recent Labs Lab 10/04/15 1842  WBC 24.8*  HGB 14.4  HCT 44.1  PLT 493*   ------------------------------------------------------------------------------------------------------------------  Chemistries   Recent Labs Lab 10/04/15 1842  NA 137  K 3.9  CL 102  CO2 24  GLUCOSE 103*  BUN 15  CREATININE 0.99  CALCIUM 9.3  AST 20  ALT 25  ALKPHOS 79  BILITOT 0.7   ------------------------------------------------------------------------------------------------------------------  Cardiac Enzymes No results for input(s): TROPONINI in the last 168  hours. ------------------------------------------------------------------------------------------------------------------  RADIOLOGY:  No results found.  EKG:   Orders placed or performed during the hospital encounter of 10/04/15  . ED EKG  . ED EKG    IMPRESSION AND PLAN:  Principal Problem:   Post tonsillectomy secondary hemorrhage - defer to ENT for primary management of this problem. We have ordered a von Willebrand panel to start the workup for his excessive bleeding. We'll defer to hematology for further workup of the same. Active Problems:   HTN (hypertension) - blood pressure has improved some with significant pain control tonight. We will order when necessary antihypertensives to keep his blood pressure to a goal of less than 160/100.  All the records are reviewed and case discussed with ED provider. Management plans discussed with the patient and/or family.  CODE STATUS:     Code Status Orders        Start     Ordered   10/05/15 0031  Full code   Continuous     10/05/15 0030    Full Code  TOTAL TIME TAKING CARE OF THIS PATIENT: 30 minutes.    Alta Goding [place] 10/05/2015, 1:21 AM  [facility]  Office  [phone]  CC: Primary care Physician: No  PCP Per Patient

## 2015-10-05 NOTE — Progress Notes (Signed)
RN offered to help pt to the chair and to ambulate, pt refused. Will continue to offer at a later time.   Ronnie Smith

## 2015-10-05 NOTE — Progress Notes (Signed)
Weaned pt off of 1 L of O2, O2 stats were 98. 20 minutes later 1 L placed back on patient because he stated he was "SOB," O2 stats were 97. Will continue to monitor pt.  Ronnie Smith

## 2015-10-05 NOTE — ED Provider Notes (Signed)
[facility] Emergency Department Provider Note  ____________________________________________  Time seen: 2: 30 a.m.  I have reviewed the triage vital signs and the nursing notes.   HISTORY  Chief Complaint Hematemesis      HPI Ronnie Smith is a 19 y.o. male presents with history of tonsillectomy performed yesterday by Dr. Aundra Dubin. Patient seen in the emergency department earlier for an episode of coughing up proximally 1/2 cup of blood. Patient was observed in the emergency department with no further bleeding and a such was discharged home mother and patient as well as Dr. Lupita Dawn was in agreement to plan. However patient stated on arrival home he began to cough up more blood and a such return to the emergency department immediately as advised.    Past Medical History  Diagnosis Date  . Colitis   . Tonsillitis   . Complication of anesthesia     Prolonged Emergence    Patient Active Problem List   Diagnosis Date Noted  . Post tonsillectomy secondary hemorrhage 10/05/2015  . HTN (hypertension) 10/05/2015  . Hemorrhage pharyngeal 10/04/2015  . S/P tonsillectomy 10/02/2015  . Perirectal abscess 04/17/2015    Past Surgical History  Procedure Laterality Date  . Incision and drainage perirectal abscess N/A 04/19/2015    Procedure: IRRIGATION AND DEBRIDEMENT PERIRECTAL ABSCESS;  Surgeon: Florene Glen, MD;  Location: [facility] ORS;  Service: General;  Laterality: N/A;  . Incision and drainage perirectal abscess  12-2014  . Tonsillectomy and adenoidectomy N/A 10/02/2015    Procedure: TONSILLECTOMY AND ADENOIDECTOMY;  Surgeon: Beverly Gust, MD;  Location: [facility] ORS;  Service: ENT;  Laterality: N/A;    No current outpatient prescriptions on file.  Allergies Doxycycline  History reviewed. No pertinent family history.  Social History Social History  Substance Use Topics  . Smoking status: Never Smoker   . Smokeless tobacco: Never Used  . Alcohol  Use: No    Review of Systems  Constitutional: Negative for fever. Eyes: Negative for visual changes. ENT: Negative for sore throat. Positive for throat pain and coughing up blood Cardiovascular: Negative for chest pain. Respiratory: Negative for shortness of breath. Gastrointestinal: Negative for abdominal pain, vomiting and diarrhea. Genitourinary: Negative for dysuria. Musculoskeletal: Negative for back pain. Skin: Negative for rash. Neurological: Negative for headaches, focal weakness or numbness.   10-point ROS otherwise negative.  ____________________________________________   PHYSICAL EXAM:  VITAL SIGNS: ED Triage Vitals  Enc Vitals Group     BP 10/04/15 0232 153/112 mmHg     Pulse Rate 10/04/15 0554 108     Resp 10/04/15 0232 18     Temp 10/04/15 0232 100.5 F (38.1 C)     Temp Source 10/04/15 0232 Oral     SpO2 10/04/15 0228 99 %     Weight 10/04/15 0232 315 lb 14.7 oz (143.3 kg)     Height 10/04/15 0554 6\' 2"  (1.88 m)     Head Cir --      Peak Flow --      Pain Score 10/04/15 0234 10     Pain Loc --      Pain Edu? --      Excl. in [place]? --    Constitutional: Alert and oriented. Well appearing and in no distress. Eyes: Conjunctivae are normal. PERRL. Normal extraocular movements. ENT   Head: Normocephalic and atraumatic.   Nose: No congestion/rhinnorhea.   Mouth/Throat: Active bleeding noted bilateral tonsillar surgical bed.   Neck: No stridor. Hematological/Lymphatic/Immunilogical: No cervical lymphadenopathy. Cardiovascular: Normal  rate, regular rhythm. Normal and symmetric distal pulses are present in all extremities. No murmurs, rubs, or gallops. Respiratory: Normal respiratory effort without tachypnea nor retractions. Breath sounds are clear and equal bilaterally. No wheezes/rales/rhonchi. Gastrointestinal: Soft and nontender. No distention. There is no CVA tenderness. Genitourinary: deferred Musculoskeletal: Nontender with normal range of  motion in all extremities. No joint effusions.  No lower extremity tenderness nor edema. Neurologic:  Normal speech and language. No gross focal neurologic deficits are appreciated. Speech is normal.  Skin:  Skin is warm, dry and intact. No rash noted. Psychiatric: Mood and affect are normal. Speech and behavior are normal. Patient exhibits appropriate insight and judgment.  ____________________________________________      INITIAL IMPRESSION / ASSESSMENT AND PLAN / ED COURSE  Pertinent labs & imaging results that were available during my care of the patient were reviewed by me and considered in my medical decision making (see chart for details).  Dr. Richardson Landry  notified immediately on patient's return to the emergency department. Plan to take patient to the operating room for cauterization.  ____________________________________________   FINAL CLINICAL IMPRESSION(S) / ED DIAGNOSES  Final diagnoses:  Post tonsillectomy secondary hemorrhage      Gregor Hams, MD 10/05/15 [phone]

## 2015-10-06 DIAGNOSIS — J95830 Postprocedural hemorrhage and hematoma of a respiratory system organ or structure following a respiratory system procedure: Secondary | ICD-10-CM | POA: Diagnosis present

## 2015-10-06 DIAGNOSIS — T888XXD Other specified complications of surgical and medical care, not elsewhere classified, subsequent encounter: Secondary | ICD-10-CM | POA: Diagnosis present

## 2015-10-06 DIAGNOSIS — Z888 Allergy status to other drugs, medicaments and biological substances status: Secondary | ICD-10-CM | POA: Diagnosis not present

## 2015-10-06 DIAGNOSIS — Z9889 Other specified postprocedural states: Secondary | ICD-10-CM | POA: Diagnosis not present

## 2015-10-06 DIAGNOSIS — E669 Obesity, unspecified: Secondary | ICD-10-CM | POA: Diagnosis present

## 2015-10-06 DIAGNOSIS — Z6841 Body Mass Index (BMI) 40.0 and over, adult: Secondary | ICD-10-CM | POA: Diagnosis not present

## 2015-10-06 DIAGNOSIS — R51 Headache: Secondary | ICD-10-CM | POA: Diagnosis present

## 2015-10-06 DIAGNOSIS — Y836 Removal of other organ (partial) (total) as the cause of abnormal reaction of the patient, or of later complication, without mention of misadventure at the time of the procedure: Secondary | ICD-10-CM | POA: Diagnosis present

## 2015-10-06 DIAGNOSIS — Z881 Allergy status to other antibiotic agents status: Secondary | ICD-10-CM | POA: Diagnosis not present

## 2015-10-06 DIAGNOSIS — I10 Essential (primary) hypertension: Secondary | ICD-10-CM | POA: Diagnosis present

## 2015-10-06 DIAGNOSIS — R531 Weakness: Secondary | ICD-10-CM | POA: Diagnosis present

## 2015-10-06 LAB — CBC
HEMATOCRIT: 36.9 % — AB (ref 40.0–52.0)
HEMOGLOBIN: 11.9 g/dL — AB (ref 13.0–18.0)
MCH: 26.8 pg (ref 26.0–34.0)
MCHC: 32.4 g/dL (ref 32.0–36.0)
MCV: 82.8 fL (ref 80.0–100.0)
PLATELETS: 482 10*3/uL — AB (ref 150–440)
RBC: 4.45 MIL/uL (ref 4.40–5.90)
RDW: 13.9 % (ref 11.5–14.5)
WBC: 13 10*3/uL — AB (ref 3.8–10.6)

## 2015-10-06 MED ORDER — AMOXICILLIN-POT CLAVULANATE 600-42.9 MG/5ML PO SUSR
600.0000 mg | Freq: Three times a day (TID) | ORAL | Status: DC
  Administered 2015-10-06 – 2015-10-07 (×2): 600 mg via ORAL
  Filled 2015-10-06 (×6): qty 5

## 2015-10-06 NOTE — Progress Notes (Signed)
Patient's hemoglobin still trending down, but no further active bleeding. Awaiting von Willebrand panel results. Will arrange close follow-up in the [place] next week. Possible discharge tomorrow.  Will follow.

## 2015-10-06 NOTE — Progress Notes (Signed)
Pt refusing abx and steroids. Pt educated on potential risks of refusal but pt states he is in to much pain. Pt given morphine for pain and instructed pt to let RN know if he could tolerate meds after pain meds kick in. Will continue to monitor.

## 2015-10-06 NOTE — Progress Notes (Signed)
[name] NAME: Ronnie Smith    MR#:  [id number]  DATE OF BIRTH:  09-04-1996  SUBJECTIVE:  CHIEF COMPLAINT:   Chief Complaint  Patient presents with  . Post-op Problem   -Patient is  status post tonsillectomy and admitted for increased post-tonsillectomy bleeding. -Complains of significant throat pain from the surgery, also has weakness in both lower extremities.  REVIEW OF SYSTEMS:  Review of Systems  Constitutional: Negative for fever and chills.  HENT: Positive for sore throat. Negative for ear discharge, ear pain and nosebleeds.   Eyes: Negative for blurred vision, double vision and photophobia.  Respiratory: Negative for cough, shortness of breath and wheezing.   Cardiovascular: Negative for chest pain, palpitations and leg swelling.  Gastrointestinal: Negative for nausea, vomiting, abdominal pain, diarrhea and constipation.  Genitourinary: Negative for dysuria, urgency and frequency.  Musculoskeletal: Positive for neck pain. Negative for myalgias.  Skin: Negative for rash.  Neurological: Positive for weakness. Negative for dizziness, tingling, tremors, speech change, focal weakness, seizures and headaches.  Psychiatric/Behavioral: Negative for depression.    DRUG ALLERGIES:   Allergies  Allergen Reactions  . Doxycycline Rash    VITALS:  Blood pressure 106/59, pulse 79, temperature 98.2 F (36.8 C), temperature source Oral, resp. rate 20, height 6\' 2"  (1.88 m), weight 142.067 kg (313 lb 3.2 oz), SpO2 100 %.  PHYSICAL EXAMINATION:  Physical Exam  GENERAL:  19 y.o.-year-old obese patient lying in the bed with no acute distress.  EYES: Pupils equal, round, reactive to light and accommodation. No scleral icterus. Extraocular muscles intact.  HEENT: Head atraumatic, normocephalic. nasopharynx clear. Oropharynx is clear except in the tonsillar region where there is postoperative changes noted, sutures in place,  no active bleeding. NECK:  Supple, no jugular venous distention. No thyroid enlargement, no tenderness.  No tender lymphadenopathy LUNGS: Normal breath sounds bilaterally, no wheezing, rales,rhonchi or crepitation. No use of accessory muscles of respiration.  CARDIOVASCULAR: S1, S2 normal. No murmurs, rubs, or gallops.  ABDOMEN: Soft, nontender, nondistended. Bowel sounds present. No organomegaly or mass.  EXTREMITIES: No pedal edema, cyanosis, or clubbing.  NEUROLOGIC: Cranial nerves II through XII are intact. Muscle strength 5/5 in upper extremities and 4 x 5 in both lower extremities. Sensation intact. Gait not checked.  PSYCHIATRIC: The patient is alert and oriented x 3.  SKIN: No obvious rash, lesion, or ulcer.    LABORATORY PANEL:   CBC  Recent Labs Lab 10/06/15 0651  WBC 13.0*  HGB 11.9*  HCT 36.9*  PLT 482*   ------------------------------------------------------------------------------------------------------------------  Chemistries   Recent Labs Lab 10/04/15 1842  NA 137  K 3.9  CL 102  CO2 24  GLUCOSE 103*  BUN 15  CREATININE 0.99  CALCIUM 9.3  AST 20  ALT 25  ALKPHOS 79  BILITOT 0.7   ------------------------------------------------------------------------------------------------------------------  Cardiac Enzymes No results for input(s): TROPONINI in the last 168 hours. ------------------------------------------------------------------------------------------------------------------  RADIOLOGY:  No results found.  EKG:   Orders placed or performed during the hospital encounter of 10/04/15  . ED EKG  . ED EKG    ASSESSMENT AND PLAN:   19 year old obese male with no significant past medical history admitted to the hospital for prolonged post tonsillar bleeding.  #1 Prolonged bleeding/ post tonsillectomy hemorrhage-patient had tonsillectomy and 10/02/2015, had to stay in the hospital until 10/03/2015 for worsening bleeding episode. He got  discharged and came back the same day for worsening bleeding again. -No active bleeding now.  Patient had prior surgical procedure-debridement of a perirectal abscess done in the past and patient denies any prolonged bleeding at the time. - no noticeable bleeding after minor cuts - bleeding time, PT, INR, APTT within normal limits - normal plt count, vWF test pending,  -Hematology consulted. Labs can be followed up as an outpatient. Patient can be discharged if his bleeding is stable.  #2 throat pain-post tonsillectomy - Management per ENT. -Remains on liquid diet. Continue pain medications - on steroids and oral augmentin  #3 HTN- from pain, improved now - monitor, IV hydralazine prn  #4 Generalized weakness- encourage ambulation  #5 Leukocytosis- from steroids and recent infection-  -Much improving now.   Patient is medically stable. Anticipating discharge tomorrow. We'll sign off. Please call if needed   All the records are reviewed and case discussed with Care Management/Social Workerr. Management plans discussed with the patient, family and they are in agreement.  CODE STATUS: Full code  TOTAL TIME TAKING CARE OF THIS PATIENT: 36 minutes.   POSSIBLE D/C tomorrow, DEPENDING ON CLINICAL CONDITION.   Rayjon Wery M.D on 10/06/2015 at 12:14 PM  Between 7am to 6pm - Pager - [phone]  After 6pm go to [email or web address] - password [email or web address] [facility]  [facility]  Office  [phone]  CC: Primary care physician; No PCP Per Patient

## 2015-10-06 NOTE — Progress Notes (Signed)
..   10/06/2015 10:15 AM  Ronnie Smith [id number]  Post-Op Day 2    Temp:  [98.2 F (36.8 C)-99.1 F (37.3 C)] 98.2 F (36.8 C) (10/29 0439) Pulse Rate:  [79-87] 79 (10/29 0439) Resp:  [18-20] 20 (10/29 0439) BP: (106-117)/(59-68) 106/59 mmHg (10/29 0439) SpO2:  [97 %-100 %] 100 % (10/29 0439),     Intake/Output Summary (Last 24 hours) at 10/06/15 1015 Last data filed at 10/06/15 0500  Gross per 24 hour  Intake   1580 ml  Output   1725 ml  Net   -145 ml    Results for orders placed or performed during the hospital encounter of 10/04/15 (from the past 24 hour(s))  CBC     Status: Abnormal   Collection Time: 10/06/15  6:51 AM  Result Value Ref Range   WBC 13.0 (H) 3.8 - 10.6 K/uL   RBC 4.45 4.40 - 5.90 MIL/uL   Hemoglobin 11.9 (L) 13.0 - 18.0 g/dL   HCT 36.9 (L) 40.0 - 52.0 %   MCV 82.8 80.0 - 100.0 fL   MCH 26.8 26.0 - 34.0 pg   MCHC 32.4 32.0 - 36.0 g/dL   RDW 13.9 11.5 - 14.5 %   Platelets 482 (H) 150 - 440 K/uL    SUBJECTIVE:  No acute events last night.  Pain present but controlled with oral medications.  Tolerating diet.  No bleeding.  Heme/Onc consult yesterday, awaiting VWD results.  So far platelet function tests OK.  OBJECTIVE:   GEN- NAD, responsive, supine in bed OC/OP-  Healing tonsillar fossas, no blood clot and no bleeding.  IMPRESSION:  S/p control of tonsillar hemorrhage  PLAN:  Continue to observe one more day.  Convert admission to inpatient given that it will be over 48 hours.  Wean off oxygen.  Anticipate d/c tomorrow if continues to have improvement.  Appreciate Heme/Onc and Medicine input.  Jonette Wassel 10/06/2015, 10:15 AM

## 2015-10-07 LAB — CBC
HEMATOCRIT: 38.4 % — AB (ref 40.0–52.0)
Hemoglobin: 12.7 g/dL — ABNORMAL LOW (ref 13.0–18.0)
MCH: 27.3 pg (ref 26.0–34.0)
MCHC: 32.9 g/dL (ref 32.0–36.0)
MCV: 83 fL (ref 80.0–100.0)
PLATELETS: 546 10*3/uL — AB (ref 150–440)
RBC: 4.63 MIL/uL (ref 4.40–5.90)
RDW: 14.1 % (ref 11.5–14.5)
WBC: 10.6 10*3/uL (ref 3.8–10.6)

## 2015-10-07 MED ORDER — PREDNISONE 10 MG (21) PO TBPK: ORAL_TABLET | ORAL | Status: DC

## 2015-10-07 MED ORDER — MENTHOL 3 MG MT LOZG
1.0000 | LOZENGE | OROMUCOSAL | Status: DC | PRN
  Filled 2015-10-07: qty 9

## 2015-10-07 MED ORDER — AMOXICILLIN-POT CLAVULANATE 600-42.9 MG/5ML PO SUSR: 600.0000 mg | Freq: Three times a day (TID) | ORAL | Status: DC

## 2015-10-07 MED ORDER — MENTHOL 3 MG MT LOZG: 1.0000 | LOZENGE | OROMUCOSAL | Status: DC | PRN

## 2015-10-07 MED ORDER — OXYCODONE HCL 5 MG/5ML PO SOLN: 5.0000 mg | ORAL | Status: DC | PRN

## 2015-10-07 MED ORDER — DOCUSATE SODIUM 100 MG PO CAPS: 100.0000 mg | ORAL_CAPSULE | Freq: Two times a day (BID) | ORAL | Status: DC

## 2015-10-07 NOTE — Progress Notes (Signed)
IV and tele were removed. Discharge instructions, follow-up appointments, and prescriptions were provided to the pt. The pt was taken downstairs via wheelchair by Nurse Tech.

## 2015-10-07 NOTE — Progress Notes (Signed)
..   10/07/2015 10:15 AM  Ronnie Smith [id number]  Post-Op Day 3    Temp:  [98.2 F (36.8 C)-99 F (37.2 C)] 98.2 F (36.8 C) (10/30 0401) Pulse Rate:  [87-91] 89 (10/30 0401) Resp:  [17-24] 24 (10/30 0401) BP: (98-119)/(56-65) 107/56 mmHg (10/30 0401) SpO2:  [97 %-98 %] 97 % (10/30 0401),     Intake/Output Summary (Last 24 hours) at 10/07/15 1015 Last data filed at 10/07/15 1008  Gross per 24 hour  Intake      0 ml  Output   1250 ml  Net  -1250 ml    Results for orders placed or performed during the hospital encounter of 10/04/15 (from the past 24 hour(s))  CBC     Status: Abnormal   Collection Time: 10/07/15  5:24 AM  Result Value Ref Range   WBC 10.6 3.8 - 10.6 K/uL   RBC 4.63 4.40 - 5.90 MIL/uL   Hemoglobin 12.7 (L) 13.0 - 18.0 g/dL   HCT 38.4 (L) 40.0 - 52.0 %   MCV 83.0 80.0 - 100.0 fL   MCH 27.3 26.0 - 34.0 pg   MCHC 32.9 32.0 - 36.0 g/dL   RDW 14.1 11.5 - 14.5 %   Platelets 546 (H) 150 - 440 K/uL    SUBJECTIVE:  No acute events.  Continues to have pain but improved with medications.  Stable Hgb.  OBJECTIVE:   GEN- Alert and responsive OC/OP- Tonsil fossa clear and healing well with no evidence of bleeding  IMPRESSION:  S/p tonsillectomy and control of post-operative hemorrhage X 2 with negative coagulopathy workup and stable Hgb.  PLAN:  Discharge home with follow up this next week with Dr. Tami Ribas.  Discharge on augmentin and oxycodone.  [employer] 10/07/2015, 10:15 AM

## 2015-10-07 NOTE — Discharge Summary (Signed)
Physician Discharge Summary  Patient ID: ROLF FELLS MRN: [id number] DOB/AGE: Oct 29, 1996 19 y.o.  Admit date: 10/04/2015 Discharge date: 10/07/2015  Admission Diagnoses:  Discharge Diagnoses:  Principal Problem:   Post tonsillectomy secondary hemorrhage Active Problems:   HTN (hypertension)   Discharged Condition: good  Hospital Course: Admitting on 10/27 with post-tonsillectomy hemorrhage.  Taken to OR and underwent control.  Following surgery admitted to floor and underwent consultations with Medicine as well as Hematology for evaluation of blood pressure and possible bleeding issues.  Workup was negative with no evidence of bleeding disorder.  Following procedure, Hgb was monitored and noted have stabilized.  At time of discharge, patient tolerating fluids, pain improved with medications, and fever resolved.  Patient will follow up with Dr. Tami Ribas mid next week for repeat evaluation.  Consults: Medicine, Heme/Onc  Significant Diagnostic Studies: labs  Treatments: IV hydration  Discharge Exam: Blood pressure 107/56, pulse 89, temperature 98.2 F (36.8 C), temperature source Oral, resp. rate 24, height 6\' 2"  (1.88 m), weight 142.067 kg (313 lb 3.2 oz), SpO2 97 %. General appearance: alert  OC/OP- Tonsil fossa clear with normal healing, no evidence of bleeding  Disposition: 01-Home or Self Care  Discharge Instructions    Call MD for:  difficulty breathing, headache or visual disturbances    Complete by:  As directed      Call MD for:  extreme fatigue    Complete by:  As directed      Call MD for:  hives    Complete by:  As directed      Call MD for:  persistant dizziness or light-headedness    Complete by:  As directed      Call MD for:  persistant nausea and vomiting    Complete by:  As directed      Call MD for:  redness, tenderness, or signs of infection (pain, swelling, redness, odor or green/yellow discharge around incision site)    Complete by:  As directed       Call MD for:  severe uncontrolled pain    Complete by:  As directed      Call MD for:  temperature >100.4    Complete by:  As directed      Diet general    Complete by:  As directed   Soft Diet with No hard or crunchy foods     Discharge instructions    Complete by:  As directed   Follow up with Dr. Carloyn Manner at [facility] in 3 weeks for post-operative evaluation     Increase activity slowly    Complete by:  As directed      Other Restrictions    Complete by:  As directed   No heavy lifting or strenuous activity for 2 weeks            Medication List    STOP taking these medications        HYDROcodone-acetaminophen 7.5-325 mg/15 ml solution  Commonly known as:  HYCET      TAKE these medications        amoxicillin-clavulanate 600-42.9 MG/5ML suspension  Commonly known as:  AUGMENTIN  Take 5 mLs (600 mg total) by mouth 3 (three) times daily.     docusate sodium 100 MG capsule  Commonly known as:  COLACE  Take 1 capsule (100 mg total) by mouth 2 (two) times daily.     menthol-cetylpyridinium 3 MG lozenge  Commonly known as:  CEPACOL  Take 1 lozenge (  3 mg total) by mouth as needed for sore throat.     ondansetron 4 MG tablet  Commonly known as:  ZOFRAN  Take 1-2 tabs by mouth every 8 hours as needed for nausea/vomiting     oxyCODONE 5 MG/5ML solution  Commonly known as:  ROXICODONE  Take 5 mLs (5 mg total) by mouth every 4 (four) hours as needed for moderate pain.     predniSONE 10 MG (21) Tbpk tablet  Commonly known as:  STERAPRED UNI-PAK 21 TAB  steraped Regular strength 6 day taper         Signed: Lenell Lama 10/07/2015, 10:11 AM  2

## 2015-10-07 NOTE — Progress Notes (Signed)
Pt. Complained of a sore throat this AM, I called the hospitalist and was able to get some Cepachol logenzes. Pt. has been suctioning his saliva all night and not drinking as he noted it hurt his  Throat. I advised him to try and swallow some fluids so as to keep his throat moist and make take away some of the dry,  sore feeling.

## 2015-10-09 LAB — VON WILLEBRAND PANEL
Coagulation Factor VIII: 184 % — ABNORMAL HIGH (ref 57–163)
Ristocetin Co-factor, Plasma: 130 % (ref 50–200)
Von Willebrand Antigen, Plasma: 171 % (ref 50–200)

## 2015-10-09 LAB — COAG STUDIES INTERP REPORT: PDF Image: 0

## 2015-10-12 ENCOUNTER — Inpatient Hospital Stay: Payer: BC Managed Care – PPO | Attending: Oncology | Admitting: Oncology

## 2015-10-12 ENCOUNTER — Inpatient Hospital Stay: Payer: BC Managed Care – PPO

## 2015-10-12 VITALS — BP 156/80 | HR 96 | Temp 97.7°F | Wt 301.1 lb

## 2015-10-12 DIAGNOSIS — D473 Essential (hemorrhagic) thrombocythemia: Secondary | ICD-10-CM | POA: Diagnosis not present

## 2015-10-12 DIAGNOSIS — Z9889 Other specified postprocedural states: Secondary | ICD-10-CM | POA: Insufficient documentation

## 2015-10-12 DIAGNOSIS — R58 Hemorrhage, not elsewhere classified: Secondary | ICD-10-CM | POA: Insufficient documentation

## 2015-10-12 LAB — CBC WITH DIFFERENTIAL/PLATELET
BASOS PCT: 0 %
Basophils Absolute: 0 10*3/uL (ref 0–0.1)
Eosinophils Absolute: 0.1 10*3/uL (ref 0–0.7)
Eosinophils Relative: 1 %
HEMATOCRIT: 39.1 % — AB (ref 40.0–52.0)
HEMOGLOBIN: 12.7 g/dL — AB (ref 13.0–18.0)
LYMPHS ABS: 3.3 10*3/uL (ref 1.0–3.6)
Lymphocytes Relative: 36 %
MCH: 26.8 pg (ref 26.0–34.0)
MCHC: 32.6 g/dL (ref 32.0–36.0)
MCV: 82.3 fL (ref 80.0–100.0)
MONOS PCT: 10 %
Monocytes Absolute: 0.9 10*3/uL (ref 0.2–1.0)
NEUTROS ABS: 4.8 10*3/uL (ref 1.4–6.5)
NEUTROS PCT: 53 %
Platelets: 612 10*3/uL — ABNORMAL HIGH (ref 150–440)
RBC: 4.75 MIL/uL (ref 4.40–5.90)
RDW: 14.2 % (ref 11.5–14.5)
WBC: 9.2 10*3/uL (ref 3.8–10.6)

## 2015-10-12 LAB — PROTIME-INR
INR: 0.97
PROTHROMBIN TIME: 12.9 s (ref 11.4–15.0)

## 2015-10-12 LAB — APTT: aPTT: 34 seconds (ref 24–36)

## 2015-10-14 LAB — VON WILLEBRAND PANEL
Coagulation Factor VIII: 113 % (ref 57–163)
RISTOCETIN CO-FACTOR, PLASMA: 54 % (ref 50–200)
VON WILLEBRAND ANTIGEN, PLASMA: 79 % (ref 50–200)

## 2015-10-14 LAB — COAG STUDIES INTERP REPORT: PDF IMAGE: 0

## 2015-10-19 ENCOUNTER — Telehealth: Payer: Self-pay | Admitting: *Deleted

## 2015-10-19 NOTE — Progress Notes (Signed)
[place]  Telephone:([phone]) [phone] [phone]) [phone]  ID: Ronnie Smith OB: February 26, 1996  MR#: [id number]  [id number]  Patient Care Team: No Pcp Per Patient as PCP - General (General Practice)  CHIEF COMPLAINT:  Chief Complaint  Patient presents with  . Follow-up    hospital follow up and elevated coag level    INTERVAL HISTORY: Patient returns to clinic for hospital follow up.  He is fully recovered from is tonsillectomy and denies any further bleeding. He continues to have mild throat discomfort. He has no neurologic complaints. He denies any fevers. He denies any chest pain or shortness of breath. He denies any nausea, vomiting, consultation, or diarrhea. He denies any personal or family history of excessive bleeding. Patient offers no specific complaints today.  REVIEW OF SYSTEMS:   Review of Systems  Constitutional: Negative.   HENT: Positive for sore throat. Negative for nosebleeds.   Respiratory: Negative.   Cardiovascular: Negative.   Gastrointestinal: Negative.   Musculoskeletal: Negative.   Neurological: Negative.   Endo/Heme/Allergies: Does not bruise/bleed easily.    As per HPI. Otherwise, a complete review of systems is negatve.  PAST MEDICAL HISTORY: Past Medical History  Diagnosis Date  . Colitis   . Tonsillitis   . Complication of anesthesia     Prolonged Emergence    PAST SURGICAL HISTORY: Past Surgical History  Procedure Laterality Date  . Incision and drainage perirectal abscess N/A 04/19/2015    Procedure: IRRIGATION AND DEBRIDEMENT PERIRECTAL ABSCESS;  Surgeon: Florene Glen, MD;  Location: [facility] ORS;  Service: General;  Laterality: N/A;  . Incision and drainage perirectal abscess  12-2014  . Tonsillectomy and adenoidectomy N/A 10/02/2015    Procedure: TONSILLECTOMY AND ADENOIDECTOMY;  Surgeon: Beverly Gust, MD;  Location: [facility] ORS;  Service: ENT;  Laterality: N/A;  . Tonsillectomy N/A 10/04/2015    Procedure:  control  of TONSILLECTOMY hemorrage ;  Surgeon: Carloyn Manner, MD;  Location: [facility] ORS;  Service: ENT;  Laterality: N/A;  . Tonsillectomy and adenoidectomy N/A 10/04/2015    Procedure: control TONSILLECTOMY hemorrage;  Surgeon: Clyde Canterbury, MD;  Location: [facility] ORS;  Service: ENT;  Laterality: N/A;    FAMILY HISTORY: Reviewed and noncontributory.  No history of bleeding problems.     ADVANCED DIRECTIVES:    HEALTH MAINTENANCE: Social History  Substance Use Topics  . Smoking status: Never Smoker   . Smokeless tobacco: Never Used  . Alcohol Use: No     Colonoscopy:  PAP:  Bone density:  Lipid panel:  Allergies  Allergen Reactions  . Doxycycline Rash    Current Outpatient Prescriptions  Medication Sig Dispense Refill  . amoxicillin-clavulanate (AUGMENTIN) 600-42.9 MG/5ML suspension Take 5 mLs (600 mg total) by mouth 3 (three) times daily. (Patient not taking: Reported on 10/12/2015) 200 mL 0  . menthol-cetylpyridinium (CEPACOL) 3 MG lozenge Take 1 lozenge (3 mg total) by mouth as needed for sore throat. (Patient not taking: Reported on 10/12/2015) 100 tablet 12  . ondansetron (ZOFRAN) 4 MG tablet Take 1-2 tabs by mouth every 8 hours as needed for nausea/vomiting (Patient not taking: Reported on 10/12/2015) 30 tablet 0  . oxyCODONE (ROXICODONE) 5 MG/5ML solution Take 5 mLs (5 mg total) by mouth every 4 (four) hours as needed for moderate pain. (Patient not taking: Reported on 10/12/2015) 500 mL 0  . predniSONE (STERAPRED UNI-PAK 21 TAB) 10 MG (21) TBPK tablet steraped Regular strength 6 day taper (Patient not taking: Reported on 10/12/2015) 21 tablet 0   No  current facility-administered medications for this visit.    OBJECTIVE: Filed Vitals:   10/12/15 1047  BP: 156/80  Pulse: 96  Temp: 97.7 F (36.5 C)     Body mass index is 38.65 kg/(m^2).    ECOG FS:0 - Asymptomatic  General: Well-developed, well-nourished, no acute distress. Eyes: Pink conjunctiva, anicteric sclera. HEENT:  Normocephalic, moist mucous membranes, clear oropharnyx. Lungs: Clear to auscultation bilaterally. Heart: Regular rate and rhythm. No rubs, murmurs, or gallops. Abdomen: Soft, nontender, nondistended. No organomegaly noted, normoactive bowel sounds. Musculoskeletal: No edema, cyanosis, or clubbing. Neuro: Alert, answering all questions appropriately. Cranial nerves grossly intact. Skin: No rashes or petechiae noted. Psych: Normal affect.   LAB RESULTS:  Lab Results  Component Value Date   NA 137 10/04/2015   K 3.9 10/04/2015   CL 102 10/04/2015   CO2 24 10/04/2015   GLUCOSE 103* 10/04/2015   BUN 15 10/04/2015   CREATININE 0.99 10/04/2015   CALCIUM 9.3 10/04/2015   PROT 8.4* 10/04/2015   ALBUMIN 4.4 10/04/2015   AST 20 10/04/2015   ALT 25 10/04/2015   ALKPHOS 79 10/04/2015   BILITOT 0.7 10/04/2015   GFRNONAA >60 10/04/2015   GFRAA >60 10/04/2015    Lab Results  Component Value Date   WBC 9.2 10/12/2015   NEUTROABS 4.8 10/12/2015   HGB 12.7* 10/12/2015   HCT 39.1* 10/12/2015   MCV 82.3 10/12/2015   PLT 612* 10/12/2015     STUDIES: No results found.  ASSESSMENT: Increased bleeding after tonsillectomy, resolved.  PLAN:    1. Excessive bleeding after tonsillectomy: Resolved.  Previously, patient states he has had 2 other surgeries without bleeding. He does not have any family history of bleeding problems either. Patient has no evidence of underlying platelet dysfunction.  His von Willebrand's panel is within normal limits. No further testing or follow up in necessary at this time. If patient has another episode of excessive bleeding would consider additional workup for other rare genetic disorders. 2.  Thrombocytosis: Likely reactive.  Repeat CBC in 3 months with PCP.  Patient expressed understanding and was in agreement with this plan. He also understands that He can call clinic at any time with any questions, concerns, or complaints.    Lloyd Huger, MD    10/19/2015 6:31 AM

## 2015-10-19 NOTE — Telephone Encounter (Signed)
Call placed to patient to inform him lab results all okay, no need for follow up at cancer center.

## 2015-10-20 LAB — ABO/RH: ABO/RH(D): O POS

## 2016-02-19 ENCOUNTER — Emergency Department
Admission: EM | Admit: 2016-02-19 | Discharge: 2016-02-19 | Disposition: A | Discharge status: Home / Self Care | Payer: BC Managed Care – PPO | Attending: Emergency Medicine | Admitting: Emergency Medicine

## 2016-02-19 ENCOUNTER — Encounter: Payer: Self-pay | Admitting: Emergency Medicine

## 2016-02-19 DIAGNOSIS — R112 Nausea with vomiting, unspecified: Secondary | ICD-10-CM | POA: Diagnosis present

## 2016-02-19 DIAGNOSIS — R197 Diarrhea, unspecified: Secondary | ICD-10-CM

## 2016-02-19 DIAGNOSIS — I10 Essential (primary) hypertension: Secondary | ICD-10-CM | POA: Insufficient documentation

## 2016-02-19 DIAGNOSIS — Z79899 Other long term (current) drug therapy: Secondary | ICD-10-CM | POA: Insufficient documentation

## 2016-02-19 DIAGNOSIS — J111 Influenza due to unidentified influenza virus with other respiratory manifestations: Secondary | ICD-10-CM

## 2016-02-19 DIAGNOSIS — A084 Viral intestinal infection, unspecified: Secondary | ICD-10-CM | POA: Diagnosis not present

## 2016-02-19 DIAGNOSIS — R000 Tachycardia, unspecified: Secondary | ICD-10-CM | POA: Insufficient documentation

## 2016-02-19 DIAGNOSIS — Z792 Long term (current) use of antibiotics: Secondary | ICD-10-CM | POA: Diagnosis not present

## 2016-02-19 DIAGNOSIS — R69 Illness, unspecified: Secondary | ICD-10-CM

## 2016-02-19 LAB — COMPREHENSIVE METABOLIC PANEL
ALT: 36 U/L (ref 17–63)
AST: 29 U/L (ref 15–41)
Albumin: 4.5 g/dL (ref 3.5–5.0)
Alkaline Phosphatase: 78 U/L (ref 38–126)
Anion gap: 7 (ref 5–15)
BILIRUBIN TOTAL: 0.5 mg/dL (ref 0.3–1.2)
BUN: 11 mg/dL (ref 6–20)
CO2: 24 mmol/L (ref 22–32)
CREATININE: 1.18 mg/dL (ref 0.61–1.24)
Calcium: 9.2 mg/dL (ref 8.9–10.3)
Chloride: 104 mmol/L (ref 101–111)
GFR calc Af Amer: >60 mL/min (ref >60)
Glucose, Bld: 125 mg/dL — ABNORMAL HIGH (ref 65–99)
POTASSIUM: 3.4 mmol/L — AB (ref 3.5–5.1)
Sodium: 135 mmol/L (ref 135–145)
TOTAL PROTEIN: 7.8 g/dL (ref 6.5–8.1)

## 2016-02-19 LAB — CBC WITH DIFFERENTIAL/PLATELET
BASOS ABS: 0 10*3/uL (ref 0–0.1)
BASOS PCT: 0 %
EOS ABS: 0 10*3/uL (ref 0–0.7)
EOS PCT: 0 %
HCT: 45.7 % (ref 40.0–52.0)
Hemoglobin: 14.8 g/dL (ref 13.0–18.0)
Lymphocytes Relative: 15 %
Lymphs Abs: 1.2 10*3/uL (ref 1.0–3.6)
MCH: 25.6 pg — ABNORMAL LOW (ref 26.0–34.0)
MCHC: 32.5 g/dL (ref 32.0–36.0)
MCV: 78.8 fL — ABNORMAL LOW (ref 80.0–100.0)
MONO ABS: 1 10*3/uL (ref 0.2–1.0)
Monocytes Relative: 12 %
Neutro Abs: 5.9 10*3/uL (ref 1.4–6.5)
Neutrophils Relative %: 73 %
Platelets: 382 10*3/uL (ref 150–440)
RBC: 5.8 MIL/uL (ref 4.40–5.90)
RDW: 15.8 % — ABNORMAL HIGH (ref 11.5–14.5)
WBC: 8.1 10*3/uL (ref 3.8–10.6)

## 2016-02-19 LAB — LIPASE, BLOOD: LIPASE: 16 U/L (ref 11–51)

## 2016-02-19 LAB — RAPID INFLUENZA A&B ANTIGENS (ARMC ONLY): INFLUENZA A (ARMC): NEGATIVE

## 2016-02-19 LAB — RAPID INFLUENZA A&B ANTIGENS: Influenza B (ARMC): NEGATIVE

## 2016-02-19 MED ORDER — DEXAMETHASONE SODIUM PHOSPHATE 10 MG/ML IJ SOLN
INTRAMUSCULAR | Status: AC
  Filled 2016-02-19: qty 1

## 2016-02-19 MED ORDER — ONDANSETRON HCL 4 MG/2ML IJ SOLN
4.0000 mg | Freq: Once | INTRAMUSCULAR | Status: AC
  Administered 2016-02-19: 4 mg via INTRAVENOUS
  Filled 2016-02-19: qty 2

## 2016-02-19 MED ORDER — SODIUM CHLORIDE 0.9 % IV BOLUS (SEPSIS)
1000.0000 mL | Freq: Once | INTRAVENOUS | Status: AC
  Administered 2016-02-19: 1000 mL via INTRAVENOUS

## 2016-02-19 MED ORDER — ONDANSETRON 8 MG PO TBDP: 8.0000 mg | ORAL_TABLET | Freq: Three times a day (TID) | ORAL | Status: DC | PRN

## 2016-02-19 MED ORDER — DEXAMETHASONE SODIUM PHOSPHATE 10 MG/ML IJ SOLN
10.0000 mg | Freq: Once | INTRAMUSCULAR | Status: AC
  Administered 2016-02-19: 10 mg via INTRAVENOUS

## 2016-02-19 MED ORDER — RANITIDINE HCL 150 MG PO CAPS: 300.0000 mg | ORAL_CAPSULE | Freq: Two times a day (BID) | ORAL | Status: DC

## 2016-02-19 MED ORDER — NAPROXEN 500 MG PO TABS: 500.0000 mg | ORAL_TABLET | Freq: Two times a day (BID) | ORAL | Status: DC

## 2016-02-19 NOTE — ED Notes (Signed)
C/o n/v/d and bodyaches x 2 days

## 2016-02-19 NOTE — Discharge Instructions (Signed)
Nausea and Vomiting Nausea is a sick feeling that often comes before throwing up (vomiting). Vomiting is a reflex where stomach contents come out of your mouth. Vomiting can cause severe loss of body fluids (dehydration). Children and elderly adults can become dehydrated quickly, especially if they also have diarrhea. Nausea and vomiting are symptoms of a condition or disease. It is important to find the cause of your symptoms. CAUSES   Direct irritation of the stomach lining. This irritation can result from increased acid production (gastroesophageal reflux disease), infection, food poisoning, taking certain medicines (such as nonsteroidal anti-inflammatory drugs), alcohol use, or tobacco use.  Signals from the brain.These signals could be caused by a headache, heat exposure, an inner ear disturbance, increased pressure in the brain from injury, infection, a tumor, or a concussion, pain, emotional stimulus, or metabolic problems.  An obstruction in the gastrointestinal tract (bowel obstruction).  Illnesses such as diabetes, hepatitis, gallbladder problems, appendicitis, kidney problems, cancer, sepsis, atypical symptoms of a heart attack, or eating disorders.  Medical treatments such as chemotherapy and radiation.  Receiving medicine that makes you sleep (general anesthetic) during surgery. DIAGNOSIS Your caregiver may ask for tests to be done if the problems do not improve after a few days. Tests may also be done if symptoms are severe or if the reason for the nausea and vomiting is not clear. Tests may include:  Urine tests.  Blood tests.  Stool tests.  Cultures (to look for evidence of infection).  X-rays or other imaging studies. Test results can help your caregiver make decisions about treatment or the need for additional tests. TREATMENT You need to stay well hydrated. Drink frequently but in small amounts.You may wish to drink water, sports drinks, clear broth, or eat frozen  ice pops or gelatin dessert to help stay hydrated.When you eat, eating slowly may help prevent nausea.There are also some antinausea medicines that may help prevent nausea. HOME CARE INSTRUCTIONS   Take all medicine as directed by your caregiver.  If you do not have an appetite, do not force yourself to eat. However, you must continue to drink fluids.  If you have an appetite, eat a normal diet unless your caregiver tells you differently.  Eat a variety of complex carbohydrates (rice, wheat, potatoes, bread), lean meats, yogurt, fruits, and vegetables.  Avoid high-fat foods because they are more difficult to digest.  Drink enough water and fluids to keep your urine clear or pale yellow.  If you are dehydrated, ask your caregiver for specific rehydration instructions. Signs of dehydration may include:  Severe thirst.  Dry lips and mouth.  Dizziness.  Dark urine.  Decreasing urine frequency and amount.  Confusion.  Rapid breathing or pulse. SEEK IMMEDIATE MEDICAL CARE IF:   You have blood or brown flecks (like coffee grounds) in your vomit.  You have black or bloody stools.  You have a severe headache or stiff neck.  You are confused.  You have severe abdominal pain.  You have chest pain or trouble breathing.  You do not urinate at least once every 8 hours.  You develop cold or clammy skin.  You continue to vomit for longer than 24 to 48 hours.  You have a fever. MAKE SURE YOU:   Understand these instructions.  Will watch your condition.  Will get help right away if you are not doing well or get worse.   This information is not intended to replace advice given to you by your health care provider. Make sure  you discuss any questions you have with your health care provider.   Document Released: 11/24/2005 Document Revised: 02/16/2012 Document Reviewed: 04/23/2011 Elsevier Interactive Patient Education 2016 [employer]  Influenza, Adult Influenza ("the  flu") is a viral infection of the respiratory tract. It occurs more often in winter months because people spend more time in close contact with one another. Influenza can make you feel very sick. Influenza easily spreads from person to person (contagious). CAUSES  Influenza is caused by a virus that infects the respiratory tract. You can catch the virus by breathing in droplets from an infected person's cough or sneeze. You can also catch the virus by touching something that was recently contaminated with the virus and then touching your mouth, nose, or eyes. RISKS AND COMPLICATIONS You may be at risk for a more severe case of influenza if you smoke cigarettes, have diabetes, have chronic heart disease (such as heart failure) or lung disease (such as asthma), or if you have a weakened immune system. Elderly people and pregnant women are also at risk for more serious infections. The most common problem of influenza is a lung infection (pneumonia). Sometimes, this problem can require emergency medical care and may be life threatening. SIGNS AND SYMPTOMS  Symptoms typically last 4 to 10 days and may include:  Fever.  Chills.  Headache, body aches, and muscle aches.  Sore throat.  Chest discomfort and cough.  Poor appetite.  Weakness or feeling tired.  Dizziness.  Nausea or vomiting. DIAGNOSIS  Diagnosis of influenza is often made based on your history and a physical exam. A nose or throat swab test can be done to confirm the diagnosis. TREATMENT  In mild cases, influenza goes away on its own. Treatment is directed at relieving symptoms. For more severe cases, your health care provider may prescribe antiviral medicines to shorten the sickness. Antibiotic medicines are not effective because the infection is caused by a virus, not by bacteria. HOME CARE INSTRUCTIONS  Take medicines only as directed by your health care provider.  Use a cool mist humidifier to make breathing easier.  Get  plenty of rest until your temperature returns to normal. This usually takes 3 to 4 days.  Drink enough fluid to keep your urine clear or pale yellow.  Cover yourmouth and nosewhen coughing or sneezing,and wash your handswellto prevent thevirusfrom spreading.  Stay homefromwork orschool untilthe fever is gonefor at least 59full day. PREVENTION  An annual influenza vaccination (flu shot) is the best way to avoid getting influenza. An annual flu shot is now routinely recommended for all adults in the [place] IF:  You experiencechest pain, yourcough worsens,or you producemore mucus.  Youhave nausea,vomiting, ordiarrhea.  Your fever returns or gets worse. SEEK IMMEDIATE MEDICAL CARE IF:  You havetrouble breathing, you become short of breath,or your skin ornails becomebluish.  You have severe painor stiffnessin the neck.  You develop a sudden headache, or pain in the face or ear.  You have nausea or vomiting that you cannot control. MAKE SURE YOU:   Understand these instructions.  Will watch your condition.  Will get help right away if you are not doing well or get worse.   This information is not intended to replace advice given to you by your health care provider. Make sure you discuss any questions you have with your health care provider.   Document Released: 11/21/2000 Document Revised: 12/15/2014 Document Reviewed: 02/23/2012 Elsevier Interactive Patient Education [employer].

## 2016-02-19 NOTE — ED Provider Notes (Signed)
[facility] Emergency Department Provider Note  ____________________________________________  Time seen: 7:10 PM  I have reviewed the triage vital signs and the nursing notes.   HISTORY  Chief Complaint Emesis    HPI Ronnie Smith is a 20 y.o. male who complains of nausea vomiting diarrhea and body aches for the past 2 days. No significant cough runny nose sore throat or sneezing. He does work in a [employer] without 200 other people and feels it is likely he was exposed to influenza recently. He has been eating and drinking but not as much due to the GI upset. Her chest pain shortness of breath. No fever but he does have body aches and chills. Has some generalized abdominal discomfort but no focal pain.     Past Medical History  Diagnosis Date  . Colitis   . Tonsillitis   . Complication of anesthesia     Prolonged Emergence     Patient Active Problem List   Diagnosis Date Noted  . Post tonsillectomy secondary hemorrhage 10/05/2015  . HTN (hypertension) 10/05/2015  . Hemorrhage pharyngeal 10/04/2015  . S/P tonsillectomy 10/02/2015  . Perirectal abscess 04/17/2015     Past Surgical History  Procedure Laterality Date  . Incision and drainage perirectal abscess N/A 04/19/2015    Procedure: IRRIGATION AND DEBRIDEMENT PERIRECTAL ABSCESS;  Surgeon: Florene Glen, MD;  Location: [facility] ORS;  Service: General;  Laterality: N/A;  . Incision and drainage perirectal abscess  12-2014  . Tonsillectomy and adenoidectomy N/A 10/02/2015    Procedure: TONSILLECTOMY AND ADENOIDECTOMY;  Surgeon: Beverly Gust, MD;  Location: [facility] ORS;  Service: ENT;  Laterality: N/A;  . Tonsillectomy N/A 10/04/2015    Procedure:  control of TONSILLECTOMY hemorrage ;  Surgeon: Carloyn Manner, MD;  Location: [facility] ORS;  Service: ENT;  Laterality: N/A;  . Tonsillectomy and adenoidectomy N/A 10/04/2015    Procedure: control TONSILLECTOMY hemorrage;  Surgeon: Clyde Canterbury,  MD;  Location: [facility] ORS;  Service: ENT;  Laterality: N/A;     Current Outpatient Rx  Name  Route  Sig  Dispense  Refill  . amoxicillin-clavulanate (AUGMENTIN) 600-42.9 MG/5ML suspension   Oral   Take 5 mLs (600 mg total) by mouth 3 (three) times daily. Patient not taking: Reported on 10/12/2015   200 mL   0   . menthol-cetylpyridinium (CEPACOL) 3 MG lozenge   Oral   Take 1 lozenge (3 mg total) by mouth as needed for sore throat. Patient not taking: Reported on 10/12/2015   100 tablet   12   . naproxen (NAPROSYN) 500 MG tablet   Oral   Take 1 tablet (500 mg total) by mouth 2 (two) times daily with a meal.   20 tablet   0   . ondansetron (ZOFRAN ODT) 8 MG disintegrating tablet   Oral   Take 1 tablet (8 mg total) by mouth every 8 (eight) hours as needed for nausea or vomiting.   20 tablet   0   . ondansetron (ZOFRAN) 4 MG tablet      Take 1-2 tabs by mouth every 8 hours as needed for nausea/vomiting Patient not taking: Reported on 10/12/2015   30 tablet   0   . oxyCODONE (ROXICODONE) 5 MG/5ML solution   Oral   Take 5 mLs (5 mg total) by mouth every 4 (four) hours as needed for moderate pain. Patient not taking: Reported on 10/12/2015   500 mL   0   . predniSONE (STERAPRED UNI-PAK 21 TAB)  10 MG (21) TBPK tablet      steraped Regular strength 6 day taper Patient not taking: Reported on 10/12/2015   21 tablet   0   . ranitidine (ZANTAC) 150 MG capsule   Oral   Take 2 capsules (300 mg total) by mouth 2 (two) times daily.   40 capsule   0      Allergies Doxycycline   No family history on file.  Social History Social History  Substance Use Topics  . Smoking status: Never Smoker   . Smokeless tobacco: Never Used  . Alcohol Use: No    Review of Systems  Constitutional:   No fever positive chills. No weight changes Eyes:   No blurry vision or double vision.  ENT:   No sore throat.  Cardiovascular:   No chest pain. Respiratory:   No dyspnea or  cough. Gastrointestinal:   Generalized abdominal pain with vomiting and diarrhea.  No BRBPR or melena. Genitourinary:   Negative for dysuria or difficulty urinating. Musculoskeletal:   Diffuse myalgias. Skin:   Negative for rash. Neurological:   Negative for headaches, focal weakness or numbness. Psychiatric:  No anxiety or depression.   Endocrine:  No changes in energy or sleep difficulty.  10-point ROS otherwise negative.  ____________________________________________   PHYSICAL EXAM:  VITAL SIGNS: ED Triage Vitals  Enc Vitals Group     BP 02/19/16 1659 136/62 mmHg     Pulse Rate 02/19/16 1659 135     Resp 02/19/16 1659 20     Temp 02/19/16 1659 99.7 F (37.6 C)     Temp Source 02/19/16 1659 Oral     SpO2 02/19/16 1659 96 %     Weight 02/19/16 1659 312 lb (141.522 kg)     Height 02/19/16 1659 6\' 1"  (1.854 m)     Head Cir --      Peak Flow --      Pain Score 02/19/16 1700 8     Pain Loc --      Pain Edu? --      Excl. in [place]? --     Vital signs reviewed, nursing assessments reviewed.   Constitutional:   Alert and oriented. Well appearing and in no distress. Eyes:   No scleral icterus. No conjunctival pallor. PERRL. EOMI ENT   Head:   Normocephalic and atraumatic.   Nose:   No congestion/rhinnorhea. No septal hematoma   Mouth/Throat:   MMM, mild pharyngeal erythema. No peritonsillar mass.    Neck:   No stridor. No SubQ emphysema. No meningismus. Hematological/Lymphatic/Immunilogical:   No cervical lymphadenopathy. Cardiovascular:   Tachycardia heart rate 120. Symmetric bilateral radial and DP pulses.  No murmurs.  Respiratory:   Normal respiratory effort without tachypnea nor retractions. Breath sounds are clear and equal bilaterally. No wheezes/rales/rhonchi. Gastrointestinal:   Soft with mild left upper quadrant tenderness. Non distended. There is no CVA tenderness.  No rebound, rigidity, or guarding. Genitourinary:   deferred Musculoskeletal:    Nontender with normal range of motion in all extremities. No joint effusions.  No lower extremity tenderness.  No edema. Neurologic:   Normal speech and language.  CN 2-10 normal. Motor grossly intact. No gross focal neurologic deficits are appreciated.  Skin:    Skin is warm, dry and intact. No rash noted.  No petechiae, purpura, or bullae. Psychiatric:   Mood and affect are normal. ____________________________________________    LABS (pertinent positives/negatives) (all labs ordered are listed, but only abnormal results are displayed) Labs Reviewed  COMPREHENSIVE METABOLIC PANEL - Abnormal; Notable for the following:    Potassium 3.4 (*)    Glucose, Bld 125 (*)    All other components within normal limits  CBC WITH DIFFERENTIAL/PLATELET - Abnormal; Notable for the following:    MCV 78.8 (*)    MCH 25.6 (*)    RDW 15.8 (*)    All other components within normal limits  RAPID INFLUENZA A&B ANTIGENS ([facility] ONLY)  LIPASE, BLOOD   ____________________________________________   EKG    ____________________________________________    RADIOLOGY    ____________________________________________   PROCEDURES   ____________________________________________   INITIAL IMPRESSION / ASSESSMENT AND PLAN / ED COURSE  Pertinent labs & imaging results that were available during my care of the patient were reviewed by me and considered in my medical decision making (see chart for details).  Patient presents with nausea vomiting diarrhea and denies abdominal pain without any focal findings.Considering the patient's symptoms, medical history, and physical examination today, I have low suspicion for cholecystitis or biliary pathology, pancreatitis, perforation or bowel obstruction, hernia, intra-abdominal abscess, AAA or dissection, volvulus or intussusception, mesenteric ischemia, or appendicitis.  His symptoms are very much consistent with influenza-like illness. Flu swab is negative  on the rapid assay. Labs are unremarkable, vitals are unremarkable except for the tachycardia. It's likely that he is a bit dehydrated and it seems that with IV fluids and his heart rate is already improving. He does feel better after Zofran. We'll continue him on antiemetics as well as acid suppression therapy to help with the gastritis. Patient given a dose of Decadron here to assist with his symptoms and shorten's duration of symptoms.     ____________________________________________   FINAL CLINICAL IMPRESSION(S) / ED DIAGNOSES  Final diagnoses:  Influenza-like illness  Nausea vomiting and diarrhea   viral gastroenteritis    Carrie Mew, MD 02/19/16 1934

## 2016-02-19 NOTE — ED Notes (Signed)
Per MD PO challenge initiated - gave saltines and cola

## 2016-02-19 NOTE — ED Notes (Signed)
Called for room    No answer in lobby 

## 2017-01-06 ENCOUNTER — Emergency Department
Admission: EM | Admit: 2017-01-06 | Discharge: 2017-01-06 | Disposition: A | Discharge status: Home / Self Care | Payer: BC Managed Care – PPO | Attending: Emergency Medicine | Admitting: Emergency Medicine

## 2017-01-06 ENCOUNTER — Encounter: Payer: Self-pay | Admitting: Emergency Medicine

## 2017-01-06 ENCOUNTER — Emergency Department: Payer: BC Managed Care – PPO

## 2017-01-06 DIAGNOSIS — K625 Hemorrhage of anus and rectum: Secondary | ICD-10-CM | POA: Diagnosis present

## 2017-01-06 DIAGNOSIS — Z79899 Other long term (current) drug therapy: Secondary | ICD-10-CM | POA: Insufficient documentation

## 2017-01-06 DIAGNOSIS — K5190 Ulcerative colitis, unspecified, without complications: Secondary | ICD-10-CM | POA: Diagnosis not present

## 2017-01-06 DIAGNOSIS — I10 Essential (primary) hypertension: Secondary | ICD-10-CM | POA: Insufficient documentation

## 2017-01-06 DIAGNOSIS — R1030 Lower abdominal pain, unspecified: Secondary | ICD-10-CM

## 2017-01-06 LAB — COMPREHENSIVE METABOLIC PANEL
ALBUMIN: 4.6 g/dL (ref 3.5–5.0)
ALK PHOS: 74 U/L (ref 38–126)
ALT: 39 U/L (ref 17–63)
AST: 27 U/L (ref 15–41)
Anion gap: 8 (ref 5–15)
BUN: 14 mg/dL (ref 6–20)
CALCIUM: 9.4 mg/dL (ref 8.9–10.3)
CO2: 25 mmol/L (ref 22–32)
CREATININE: 1.12 mg/dL (ref 0.61–1.24)
Chloride: 105 mmol/L (ref 101–111)
GFR calc Af Amer: >60 mL/min (ref >60)
GFR calc non Af Amer: >60 mL/min (ref >60)
GLUCOSE: 93 mg/dL (ref 65–99)
Potassium: 4 mmol/L (ref 3.5–5.1)
SODIUM: 138 mmol/L (ref 135–145)
Total Bilirubin: 0.5 mg/dL (ref 0.3–1.2)
Total Protein: 7.8 g/dL (ref 6.5–8.1)

## 2017-01-06 LAB — CBC
HCT: 46.3 % (ref 40.0–52.0)
Hemoglobin: 15.7 g/dL (ref 13.0–18.0)
MCH: 28.1 pg (ref 26.0–34.0)
MCHC: 33.9 g/dL (ref 32.0–36.0)
MCV: 82.9 fL (ref 80.0–100.0)
PLATELETS: 371 10*3/uL (ref 150–440)
RBC: 5.59 MIL/uL (ref 4.40–5.90)
RDW: 14 % (ref 11.5–14.5)
WBC: 6.8 10*3/uL (ref 3.8–10.6)

## 2017-01-06 MED ORDER — HYDROCODONE-ACETAMINOPHEN 5-325 MG PO TABS: 1.0000 | ORAL_TABLET | Freq: Four times a day (QID) | ORAL | 0 refills | Status: DC | PRN

## 2017-01-06 MED ORDER — PROMETHAZINE HCL 25 MG/ML IJ SOLN
25.0000 mg | Freq: Once | INTRAMUSCULAR | Status: AC
  Administered 2017-01-06: 25 mg via INTRAVENOUS
  Filled 2017-01-06: qty 1

## 2017-01-06 MED ORDER — METHYLPREDNISOLONE SODIUM SUCC 125 MG IJ SOLR
125.0000 mg | Freq: Once | INTRAMUSCULAR | Status: AC
  Administered 2017-01-06: 125 mg via INTRAVENOUS
  Filled 2017-01-06: qty 2

## 2017-01-06 MED ORDER — HYDROMORPHONE HCL 1 MG/ML IJ SOLN
1.0000 mg | Freq: Once | INTRAMUSCULAR | Status: AC
  Administered 2017-01-06: 1 mg via INTRAVENOUS
  Filled 2017-01-06: qty 1

## 2017-01-06 MED ORDER — IOPAMIDOL (ISOVUE-300) INJECTION 61%
125.0000 mL | Freq: Once | INTRAVENOUS | Status: AC | PRN
  Administered 2017-01-06: 150 mL via INTRAVENOUS

## 2017-01-06 MED ORDER — METOCLOPRAMIDE HCL 10 MG PO TABS: 10.0000 mg | ORAL_TABLET | Freq: Three times a day (TID) | ORAL | 0 refills | Status: DC

## 2017-01-06 MED ORDER — PREDNISONE 10 MG (21) PO TBPK: ORAL_TABLET | ORAL | 0 refills | Status: DC

## 2017-01-06 MED ORDER — ONDANSETRON HCL 4 MG/2ML IJ SOLN
INTRAMUSCULAR | Status: AC
  Filled 2017-01-06: qty 2

## 2017-01-06 MED ORDER — IOPAMIDOL (ISOVUE-300) INJECTION 61%
30.0000 mL | Freq: Once | INTRAVENOUS | Status: AC | PRN
  Administered 2017-01-06: 30 mL via ORAL

## 2017-01-06 MED ORDER — ONDANSETRON 4 MG PO TBDP: 4.0000 mg | ORAL_TABLET | Freq: Three times a day (TID) | ORAL | 0 refills | Status: DC | PRN

## 2017-01-06 NOTE — ED Notes (Signed)
Pt drinking CT contrast at this time. Second bottle to be started at 1400

## 2017-01-06 NOTE — ED Provider Notes (Signed)
[facility] Emergency Department Provider Note        Time seen: ----------------------------------------- 10:28 AM on 01/06/2017 -----------------------------------------    I have reviewed the triage vital signs and the nursing notes.   HISTORY  Chief Complaint Abdominal Pain and Rectal Bleeding    HPI EDDER Smith is a 21 y.o. male who presents to the ER with increasing abdominal pain and rectal bleeding for last 2 days. Patient reports he's been diagnosed with Crohn's disease about a month ago. He was taking the medicines as prescribed but he has run out. Reportedly he was taking Bentyl, hydrocodone and another medicine that he cannot remember. He denies any vomiting. Patient reports several minutes after eating he has bloody stools. He complains of diffuse lower abdominal pain.   Past Medical History:  Diagnosis Date  . Colitis   . Complication of anesthesia    Prolonged Emergence  . Tonsillitis     Patient Active Problem List   Diagnosis Date Noted  . Post tonsillectomy secondary hemorrhage 10/05/2015  . HTN (hypertension) 10/05/2015  . Hemorrhage pharyngeal 10/04/2015  . S/P tonsillectomy 10/02/2015  . Perirectal abscess 04/17/2015    Past Surgical History:  Procedure Laterality Date  . INCISION AND DRAINAGE PERIRECTAL ABSCESS N/A 04/19/2015   Procedure: IRRIGATION AND DEBRIDEMENT PERIRECTAL ABSCESS;  Surgeon: Florene Glen, MD;  Location: [facility] ORS;  Service: General;  Laterality: N/A;  . INCISION AND DRAINAGE PERIRECTAL ABSCESS  12-2014  . TONSILLECTOMY N/A 10/04/2015   Procedure:  control of TONSILLECTOMY hemorrage ;  Surgeon: Carloyn Manner, MD;  Location: [facility] ORS;  Service: ENT;  Laterality: N/A;  . TONSILLECTOMY AND ADENOIDECTOMY N/A 10/02/2015   Procedure: TONSILLECTOMY AND ADENOIDECTOMY;  Surgeon: Beverly Gust, MD;  Location: [facility] ORS;  Service: ENT;  Laterality: N/A;  . TONSILLECTOMY AND ADENOIDECTOMY N/A 10/04/2015   Procedure: control TONSILLECTOMY hemorrage;  Surgeon: Clyde Canterbury, MD;  Location: [facility] ORS;  Service: ENT;  Laterality: N/A;    Allergies Doxycycline  Social History Social History  Substance Use Topics  . Smoking status: Never Smoker  . Smokeless tobacco: Never Used  . Alcohol use No    Review of Systems Constitutional: Negative for fever. Cardiovascular: Negative for chest pain. Respiratory: Negative for shortness of breath. Gastrointestinal: Positive for abdominal pain, rectal bleeding Genitourinary: Negative for dysuria. Musculoskeletal: Negative for back pain. Skin: Negative for rash. Neurological: Negative for headaches, focal weakness or numbness.  10-point ROS otherwise negative.  ____________________________________________   PHYSICAL EXAM:  VITAL SIGNS: ED Triage Vitals  Enc Vitals Group     BP 01/06/17 0959 (!) 135/92     Pulse Rate 01/06/17 0959 78     Resp 01/06/17 0959 18     Temp 01/06/17 0959 98.1 F (36.7 C)     Temp Source 01/06/17 0959 Oral     SpO2 01/06/17 0959 98 %     Weight 01/06/17 1000 (!) 319 lb (144.7 kg)     Height 01/06/17 1000 6\' 1"  (1.854 m)     Head Circumference --      Peak Flow --      Pain Score 01/06/17 1013 7     Pain Loc --      Pain Edu? --      Excl. in [place]? --     Constitutional: Alert and oriented. Well appearing and in no distress. Eyes: Conjunctivae are normal. PERRL. Normal extraocular movements. ENT   Head: Normocephalic and atraumatic.   Nose: No congestion/rhinnorhea.  Mouth/Throat: Mucous membranes are moist.   Neck: No stridor. Cardiovascular: Normal rate, regular rhythm. No murmurs, rubs, or gallops. Respiratory: Normal respiratory effort without tachypnea nor retractions. Breath sounds are clear and equal bilaterally. No wheezes/rales/rhonchi. Gastrointestinal: Mild lower abdominal tenderness, no rebound or guarding. Normal bowel sounds. Musculoskeletal: Nontender with normal range of  motion in all extremities. No lower extremity tenderness nor edema. Neurologic:  Normal speech and language. No gross focal neurologic deficits are appreciated.  Skin:  Skin is warm, dry and intact. No rash noted. Psychiatric: Mood and affect are normal. Speech and behavior are normal.  ____________________________________________  ED COURSE:  Pertinent labs & imaging results that were available during my care of the patient were reviewed by me and considered in my medical decision making (see chart for details). Patient presents to ER likely with Crohn's exacerbation. We will assess with labs and imaging. Clinical Course as of Jan 06 1225  Tue Jan 06, 2017  1206 Chloride: 105 [JW]    Clinical Course User Index [JW] [name], MD   Procedures ____________________________________________   LABS (pertinent positives/negatives)  Labs Reviewed  COMPREHENSIVE METABOLIC PANEL  CBC  URINALYSIS, COMPLETE (UACMP) WITH MICROSCOPIC  ____________________________________________  FINAL ASSESSMENT AND PLAN  Inflammatory bowel disease  Plan: Patient with labs as dictated above. Patient is in no distress and has a benign exam. I have preliminarily discussed with GI. I will place him on steroids and pain medicine and he'll be seen in the next several days by Dr. Vicente Males.   [name], MD   Note: This note was generated in part or whole with voice recognition software. Voice recognition is usually quite accurate but there are transcription errors that can and very often do occur. I apologize for any typographical errors that were not detected and corrected.     [name], MD 01/06/17 1226

## 2017-01-06 NOTE — ED Triage Notes (Signed)
Pt with diagnosis of crohn's 1 month ago with increasing pain and bleeding x 2 days. Pt taking meds as prescribed.

## 2017-01-06 NOTE — ED Provider Notes (Addendum)
-----------------------------------------   3:49 PM on 01/06/2017 -----------------------------------------   Blood pressure 116/63, pulse 78, temperature 98.1 F (36.7 C), temperature source Oral, resp. rate 18, height 6\' 1"  (1.854 m), weight (!) 319 lb (144.7 kg), SpO2 100 %.  Assuming care from Dr. Lenise Arena of Ronnie Smith is a 21 y.o. male with a chief complaint of Abdominal Pain and Rectal Bleeding   I was asked to f/u results of CT a/p:  CT Abdomen Pelvis W Contrast (Final result)  Result time 01/06/17 15:17:39  Final result by Genia Del, MD (01/06/17 15:17:39)           Narrative:   CLINICAL DATA: 21 year old male with abdominal pain and blood in stools for the past 2 days. Recent diagnosis of Crohn's disease. History of perirectal abscess 2016. Initial encounter.  EXAM: CT ABDOMEN AND PELVIS WITH CONTRAST  TECHNIQUE: Multidetector CT imaging of the abdomen and pelvis was performed using the standard protocol following bolus administration of intravenous contrast.  CONTRAST: 134mL ISOVUE-300 IOPAMIDOL (ISOVUE-300) INJECTION 61%  COMPARISON: 12/03/2014 CT abdomen and pelvis.  FINDINGS: Lower chest: Minimal basilar atelectasis. Heart size within normal limits.  Hepatobiliary: Suspect mild fatty infiltration. No worrisome mass identified. No calcified gallstones.  Pancreas: No mass or inflammation.  Spleen: No mass or enlargement.  Adrenals/Urinary Tract: No hydronephrosis. No renal or adrenal mass.  Stomach/Bowel:  No extra luminal bowel inflammatory process or free air. Majority of colon underdistended and evaluation limited There is however, circumferential thickening of the colon at the level of the hepatic flexure which may indicate changes of Crohn's disease whether remote or active without extra luminal inflammation.  No inflammation surrounds the appendix or terminal ileum.  No perirectal abnormality noted in this patient who had  prior peri rectal abscess.  Few dilated small bowel loops upper abdomen without point of obstruction may be fortuitous finding.  Vascular/Lymphatic: No large vessel occlusion or aneurysm. No adenopathy.  Reproductive: Prostate gland unremarkable. Noncontrast filled views the urinary bladder unremarkable.  Other: Negative.  Musculoskeletal: No worrisome osseous abnormality.  IMPRESSION: No extra luminal bowel inflammatory process or free air.  Majority of colon underdistended and evaluation limited. There is however, circumferential thickening of the colon at the level of the hepatic flexure which may indicate changes of Crohn's disease whether remote or active without extra luminal inflammation.  No inflammation surrounds the appendix or terminal ileum.  No perirectal abnormality noted in this patient who had prior peri rectal abscess.   Electronically Signed By: Genia Del M.D. On: 01/06/2017 15:17           VS remain stable. Patient will be dc home with instructions and prescriptions left by Dr. Jimmye Norman including prednisone, vicodin and I switched the ZOfran to Reglan as patient tells me he is allergic to Zofran.   Rudene Re, MD 01/06/17 [place], MD 01/06/17 [phone]

## 2017-01-06 NOTE — ED Notes (Signed)
Patient reports increase in bloody stools and lower abdominal pain in the past 2 days.  Patient has had a recent diagnosis of crohn's disease.

## 2017-01-06 NOTE — ED Notes (Signed)
Dr. Williams in room to assess patient.  Will continue to monitor.   

## 2017-01-06 NOTE — ED Notes (Signed)
Pt almost completed first bottle of oral contrast. Pt now reporting nausea. MD made aware.

## 2017-01-07 ENCOUNTER — Encounter: Payer: Self-pay | Admitting: Emergency Medicine

## 2017-01-07 ENCOUNTER — Emergency Department
Admission: EM | Admit: 2017-01-07 | Discharge: 2017-01-08 | Disposition: A | Discharge status: Home / Self Care | Payer: BC Managed Care – PPO | Attending: Emergency Medicine | Admitting: Emergency Medicine

## 2017-01-07 DIAGNOSIS — K5090 Crohn's disease, unspecified, without complications: Secondary | ICD-10-CM | POA: Diagnosis not present

## 2017-01-07 DIAGNOSIS — Z79899 Other long term (current) drug therapy: Secondary | ICD-10-CM | POA: Diagnosis not present

## 2017-01-07 DIAGNOSIS — K625 Hemorrhage of anus and rectum: Secondary | ICD-10-CM

## 2017-01-07 DIAGNOSIS — R1030 Lower abdominal pain, unspecified: Secondary | ICD-10-CM | POA: Diagnosis present

## 2017-01-07 DIAGNOSIS — I10 Essential (primary) hypertension: Secondary | ICD-10-CM | POA: Diagnosis not present

## 2017-01-07 LAB — COMPREHENSIVE METABOLIC PANEL
ALK PHOS: 76 U/L (ref 38–126)
ALT: 36 U/L (ref 17–63)
ANION GAP: 7 (ref 5–15)
AST: 25 U/L (ref 15–41)
Albumin: 4.6 g/dL (ref 3.5–5.0)
BUN: 18 mg/dL (ref 6–20)
CALCIUM: 9.1 mg/dL (ref 8.9–10.3)
CO2: 25 mmol/L (ref 22–32)
CREATININE: 1.18 mg/dL (ref 0.61–1.24)
Chloride: 106 mmol/L (ref 101–111)
Glucose, Bld: 110 mg/dL — ABNORMAL HIGH (ref 65–99)
Potassium: 3.7 mmol/L (ref 3.5–5.1)
SODIUM: 138 mmol/L (ref 135–145)
TOTAL PROTEIN: 7.6 g/dL (ref 6.5–8.1)
Total Bilirubin: 0.6 mg/dL (ref 0.3–1.2)

## 2017-01-07 LAB — URINALYSIS, COMPLETE (UACMP) WITH MICROSCOPIC
Bacteria, UA: NONE SEEN
Bilirubin Urine: NEGATIVE
GLUCOSE, UA: NEGATIVE mg/dL
HGB URINE DIPSTICK: NEGATIVE
Ketones, ur: NEGATIVE mg/dL
Leukocytes, UA: NEGATIVE
NITRITE: NEGATIVE
PH: 5 (ref 5.0–8.0)
PROTEIN: NEGATIVE mg/dL
RBC / HPF: NONE SEEN RBC/hpf (ref 0–5)
SPECIFIC GRAVITY, URINE: 1.021 (ref 1.005–1.030)
Squamous Epithelial / LPF: NONE SEEN

## 2017-01-07 LAB — CBC
HCT: 46.1 % (ref 40.0–52.0)
HEMOGLOBIN: 15.2 g/dL (ref 13.0–18.0)
MCH: 27.7 pg (ref 26.0–34.0)
MCHC: 32.9 g/dL (ref 32.0–36.0)
MCV: 84.2 fL (ref 80.0–100.0)
Platelets: 437 10*3/uL (ref 150–440)
RBC: 5.48 MIL/uL (ref 4.40–5.90)
RDW: 13.8 % (ref 11.5–14.5)
WBC: 15.7 10*3/uL — ABNORMAL HIGH (ref 3.8–10.6)

## 2017-01-07 LAB — LIPASE, BLOOD: Lipase: 25 U/L (ref 11–51)

## 2017-01-07 NOTE — ED Triage Notes (Signed)
Pt to ED from home c/o fever, n/v x3, and generalized body aches today.  States checked temp at home around 6pm and was 104.3, denies taking medication for fever.

## 2017-01-07 NOTE — ED Provider Notes (Signed)
[facility] Emergency Department Provider Note   ____________________________________________   First MD Initiated Contact with Patient 01/07/17 2356     (approximate)  I have reviewed the triage vital signs and the nursing notes.   HISTORY  Chief Complaint Fever; Nausea; and Emesis    HPI Ronnie Smith is a 21 y.o. male returns to the ED from home with a chief complaint of lower abdominal pain and rectal bleeding.patient was recently diagnosed with Crohn's disease and was seen in the ED yesterday for same. Had a CT scan which was essentially unremarkable other than changes from Crohn's disease. Reports today having fever, myalgias and vomiting. Reports fever was 104.68F; patient denies taking any antipyretics. States he has started prescriptions which she received in the ED including prednisone, Vicodin and Reglan without improvement of symptoms. Denies chest pain, shortness of breath, dysuria. Denies recent travel or trauma. Nothing makes the symptoms better or worse.   Past Medical History:  Diagnosis Date  . Colitis   . Complication of anesthesia    Prolonged Emergence  . Tonsillitis     Patient Active Problem List   Diagnosis Date Noted  . Post tonsillectomy secondary hemorrhage 10/05/2015  . HTN (hypertension) 10/05/2015  . Hemorrhage pharyngeal 10/04/2015  . S/P tonsillectomy 10/02/2015  . Perirectal abscess 04/17/2015    Past Surgical History:  Procedure Laterality Date  . INCISION AND DRAINAGE PERIRECTAL ABSCESS N/A 04/19/2015   Procedure: IRRIGATION AND DEBRIDEMENT PERIRECTAL ABSCESS;  Surgeon: Florene Glen, MD;  Location: [facility] ORS;  Service: General;  Laterality: N/A;  . INCISION AND DRAINAGE PERIRECTAL ABSCESS  12-2014  . TONSILLECTOMY N/A 10/04/2015   Procedure:  control of TONSILLECTOMY hemorrage ;  Surgeon: Carloyn Manner, MD;  Location: [facility] ORS;  Service: ENT;  Laterality: N/A;  . TONSILLECTOMY AND ADENOIDECTOMY N/A  10/02/2015   Procedure: TONSILLECTOMY AND ADENOIDECTOMY;  Surgeon: Beverly Gust, MD;  Location: [facility] ORS;  Service: ENT;  Laterality: N/A;  . TONSILLECTOMY AND ADENOIDECTOMY N/A 10/04/2015   Procedure: control TONSILLECTOMY hemorrage;  Surgeon: Clyde Canterbury, MD;  Location: [facility] ORS;  Service: ENT;  Laterality: N/A;    Prior to Admission medications   Medication Sig Start Date End Date Taking? Authorizing Provider  dicyclomine (BENTYL) 20 MG tablet Take 1 tablet (20 mg total) by mouth every 6 (six) hours as needed. 01/08/17   Paulette Blanch, MD  HYDROcodone-acetaminophen (NORCO/VICODIN) 5-325 MG tablet Take 1 tablet by mouth every 6 (six) hours as needed for moderate pain. 01/06/17   [name], MD  metoCLOPramide (REGLAN) 10 MG tablet Take 1 tablet (10 mg total) by mouth 3 (three) times daily with meals. 01/06/17 01/06/18  Rudene Re, MD  naproxen (NAPROSYN) 500 MG tablet Take 1 tablet (500 mg total) by mouth 2 (two) times daily with a meal. 02/19/16   Carrie Mew, MD  ondansetron (ZOFRAN ODT) 4 MG disintegrating tablet Take 1 tablet (4 mg total) by mouth every 8 (eight) hours as needed for nausea or vomiting. 01/06/17   [name], MD  ondansetron (ZOFRAN ODT) 8 MG disintegrating tablet Take 1 tablet (8 mg total) by mouth every 8 (eight) hours as needed for nausea or vomiting. 02/19/16   Carrie Mew, MD  oxyCODONE (ROXICODONE) 5 MG/5ML solution Take 5 mLs (5 mg total) by mouth every 4 (four) hours as needed for moderate pain. Patient not taking: Reported on 10/12/2015 10/07/15   Carloyn Manner, MD  oxyCODONE-acetaminophen (PERCOCET) 10-325 MG tablet Take 1 tablet by  mouth every 6 (six) hours as needed for pain. 01/08/17   Paulette Blanch, MD  predniSONE (STERAPRED UNI-PAK 21 TAB) 10 MG (21) TBPK tablet Dispense steroid taper pack as instructed 01/06/17   [name], MD  ranitidine (ZANTAC) 150 MG capsule Take 2 capsules (300 mg total) by mouth 2 (two) times daily.  02/19/16   Carrie Mew, MD    Allergies Doxycycline and Zofran Alvis Lemmings hcl]  History reviewed. No pertinent family history.  Social History Social History  Substance Use Topics  . Smoking status: Never Smoker  . Smokeless tobacco: Never Used  . Alcohol use No    Review of Systems  Constitutional: Positive for fever/chills. Eyes: No visual changes. ENT: No sore throat. Cardiovascular: Denies chest pain. Respiratory: Denies shortness of breath. Gastrointestinal: Positive for abdominal pain. Positive for nausea and vomiting.  No diarrhea.  No constipation. Genitourinary: Negative for dysuria. Musculoskeletal: Negative for back pain. Skin: Negative for rash. Neurological: Negative for headaches, focal weakness or numbness.  10-point ROS otherwise negative.  ____________________________________________   PHYSICAL EXAM:  VITAL SIGNS: ED Triage Vitals  Enc Vitals Group     BP 01/07/17 2004 132/86     Pulse Rate 01/07/17 2004 (!) 111     Resp 01/07/17 2004 18     Temp 01/07/17 2004 98.6 F (37 C)     Temp Source 01/07/17 2004 Oral     SpO2 01/07/17 2004 95 %     Weight 01/07/17 2006 (!) 319 lb (144.7 kg)     Height 01/07/17 2006 6\' 1"  (1.854 m)     Head Circumference --      Peak Flow --      Pain Score 01/07/17 2006 7     Pain Loc --      Pain Edu? --      Excl. in [place]? --     Constitutional: Alert and oriented. Well appearing and in no acute distress. Eyes: Conjunctivae are normal. PERRL. EOMI. Head: Atraumatic. Nose: No congestion/rhinnorhea. Mouth/Throat: Mucous membranes are moist.  Oropharynx non-erythematous. Neck: No stridor.  Supple neck without meningismus. Hematological/Lymphatic/Immunilogical: No cervical lymphadenopathy. Cardiovascular: Normal rate, regular rhythm. Grossly normal heart sounds.  Good peripheral circulation. Respiratory: Normal respiratory effort.  No retractions. Lungs CTAB. Gastrointestinal: Soft and mildly tender to  palpation lower abdomen without rebound or guarding. No distention. No abdominal bruits. No CVA tenderness. Musculoskeletal: No lower extremity tenderness nor edema.  No joint effusions. Neurologic:  Normal speech and language. No gross focal neurologic deficits are appreciated. No gait instability. Skin:  Skin is warm, dry and intact. No rash noted. No petechiae. Psychiatric: Mood and affect are normal. Speech and behavior are normal.  ____________________________________________   LABS (all labs ordered are listed, but only abnormal results are displayed)  Labs Reviewed  CBC - Abnormal; Notable for the following:       Result Value   WBC 15.7 (*)    All other components within normal limits  COMPREHENSIVE METABOLIC PANEL - Abnormal; Notable for the following:    Glucose, Bld 110 (*)    All other components within normal limits  URINALYSIS, COMPLETE (UACMP) WITH MICROSCOPIC - Abnormal; Notable for the following:    Color, Urine YELLOW (*)    APPearance CLEAR (*)    All other components within normal limits  LIPASE, BLOOD  INFLUENZA PANEL BY PCR (TYPE A & B)  MONONUCLEOSIS SCREEN   ____________________________________________  EKG  none ____________________________________________  RADIOLOGY  none ____________________________________________  PROCEDURES  Procedure(s) performed:   Rectal deferred  Procedures  Critical Care performed: No  ____________________________________________   INITIAL IMPRESSION / ASSESSMENT AND PLAN / ED COURSE  Pertinent labs & imaging results that were available during my care of the patient were reviewed by me and considered in my medical decision making (see chart for details).  21 year old male, recently diagnosed with Crohn's disease,who returns for continued lower abdominal pain and rectal bleeding. Reviewed patient's lab work and CT scan from prior visits. He does have a leukocytosis today which is likely secondary to  steroids. Also new today are fever and myalgias suspicious for flulike illness. Will add influenza swab, initiate IV fluid resuscitation, Solu-Medrol, analgesics and reassess.  Clinical Course as of Jan 08 325  Thu Jan 08, 2017  0255 Updated patient of negative influenza results. Have called lab several times regarding mono screen; result is still not back.Patient is eager for discharge. Do not think the result of the mono screen would hinder his disposition. Have advised him to refrain from contact sports activities, hydrate and rest. Will prescribe Bentyl as well as limited quantity Percocet. Patient has an appointment already scheduled with GI next week. Strict return precautions given. Patient verbalizes understanding and agrees with plan of care.  [JS]    Clinical Course User Index [JS] Paulette Blanch, MD     ____________________________________________   FINAL CLINICAL IMPRESSION(S) / ED DIAGNOSES  Final diagnoses:  Lower abdominal pain  Rectal bleeding  Crohn's disease without complication, unspecified gastrointestinal tract location [facility])      NEW MEDICATIONS STARTED DURING THIS VISIT:  New Prescriptions   DICYCLOMINE (BENTYL) 20 MG TABLET    Take 1 tablet (20 mg total) by mouth every 6 (six) hours as needed.   OXYCODONE-ACETAMINOPHEN (PERCOCET) 10-325 MG TABLET    Take 1 tablet by mouth every 6 (six) hours as needed for pain.     Note:  This document was prepared using Dragon voice recognition software and may include unintentional dictation errors.    Paulette Blanch, MD 01/08/17 [phone]

## 2017-01-07 NOTE — ED Notes (Signed)
Pt reports low abd pain with rectal bleeding.  Pt states I was in the ER yesterday with similar sx .  Pt states i'm not feeling any better.  Pt alert.

## 2017-01-07 NOTE — ED Notes (Signed)
Urine and lab specimens sent to lab

## 2017-01-08 LAB — INFLUENZA PANEL BY PCR (TYPE A & B)
INFLAPCR: NEGATIVE
INFLBPCR: NEGATIVE

## 2017-01-08 MED ORDER — DICYCLOMINE HCL 20 MG PO TABS: 20.0000 mg | ORAL_TABLET | Freq: Four times a day (QID) | ORAL | 0 refills | Status: DC | PRN

## 2017-01-08 MED ORDER — OXYCODONE-ACETAMINOPHEN 10-325 MG PO TABS: 1.0000 | ORAL_TABLET | Freq: Four times a day (QID) | ORAL | 0 refills | Status: DC | PRN

## 2017-01-08 MED ORDER — MORPHINE SULFATE (PF) 4 MG/ML IV SOLN
4.0000 mg | Freq: Once | INTRAVENOUS | Status: AC
Start: 2017-01-08 — End: 2017-01-08
  Administered 2017-01-08: 4 mg via INTRAVENOUS
  Filled 2017-01-08: qty 1

## 2017-01-08 MED ORDER — PROMETHAZINE HCL 25 MG/ML IJ SOLN
12.5000 mg | Freq: Once | INTRAMUSCULAR | Status: AC
  Administered 2017-01-08: 12.5 mg via INTRAVENOUS
  Filled 2017-01-08: qty 1

## 2017-01-08 MED ORDER — DICYCLOMINE HCL 20 MG PO TABS
20.0000 mg | ORAL_TABLET | Freq: Once | ORAL | Status: AC
  Administered 2017-01-08: 20 mg via ORAL
  Filled 2017-01-08: qty 1

## 2017-01-08 MED ORDER — OXYCODONE-ACETAMINOPHEN 5-325 MG PO TABS
2.0000 | ORAL_TABLET | Freq: Once | ORAL | Status: AC
  Administered 2017-01-08: 2 via ORAL
  Filled 2017-01-08: qty 2

## 2017-01-08 MED ORDER — SODIUM CHLORIDE 0.9 % IV BOLUS (SEPSIS)
1000.0000 mL | Freq: Once | INTRAVENOUS | Status: AC
  Administered 2017-01-08: 1000 mL via INTRAVENOUS

## 2017-01-08 MED ORDER — METHYLPREDNISOLONE SODIUM SUCC 125 MG IJ SOLR
80.0000 mg | Freq: Once | INTRAMUSCULAR | Status: AC
  Administered 2017-01-08: 80 mg via INTRAVENOUS
  Filled 2017-01-08: qty 2

## 2017-01-08 NOTE — ED Notes (Signed)

## 2017-01-08 NOTE — Discharge Instructions (Signed)
1. Continue and finish your steroid prescription as previously prescribed. 2. You may continue Reglan as previously prescribed as needed for nausea. 3. Take Bentyl as needed for discomfort. 4. You may take Percocet as needed for more severe pain. Do not take additional pain medicines such as Vicodin if you are taking Percocet. 5. Return to the ER for worsening symptoms, persistent vomiting, difficulty breathing or other concerns.

## 2017-01-08 NOTE — ED Notes (Signed)
Report off to allison rn  

## 2017-01-08 NOTE — ED Notes (Signed)
Pt sleeping   siderails up x 2. 

## 2017-01-08 NOTE — ED Notes (Signed)
Pt resting quietly.   States pain not any better   md aware.

## 2017-01-13 ENCOUNTER — Encounter: Payer: Self-pay | Admitting: Gastroenterology

## 2017-01-13 ENCOUNTER — Other Ambulatory Visit: Payer: Self-pay

## 2017-01-13 ENCOUNTER — Ambulatory Visit (INDEPENDENT_AMBULATORY_CARE_PROVIDER_SITE_OTHER): Payer: BC Managed Care – PPO | Admitting: Gastroenterology

## 2017-01-13 VITALS — BP 144/81 | HR 101 | Temp 98.9°F | Ht 73.0 in | Wt 328.0 lb

## 2017-01-13 DIAGNOSIS — R1032 Left lower quadrant pain: Secondary | ICD-10-CM

## 2017-01-13 DIAGNOSIS — R197 Diarrhea, unspecified: Secondary | ICD-10-CM

## 2017-01-13 DIAGNOSIS — R1031 Right lower quadrant pain: Secondary | ICD-10-CM | POA: Diagnosis not present

## 2017-01-13 MED ORDER — PEG 3350-KCL-NABCB-NACL-NASULF 236 G PO SOLR: ORAL | 0 refills | Status: DC

## 2017-01-13 NOTE — Progress Notes (Signed)
Gastroenterology Consultation  Referring Provider:     Derinda Late, MD Primary Care Physician:  Marcello Fennel, MD Primary Gastroenterologist:  Dr. Allen Norris     Reason for Consultation:     Possible Crohn's disease        HPI:   Ronnie Smith is a 21 y.o. y/o male referred for consultation & management of Possible Crohn's disease by Dr. Marcello Fennel, MD.  This patient comes in today after being seen in the ER at the beginning of this month for abdominal pain. The patient had a CT scan with possible thickening in the region of the hepatic flexure suggestive of inflammation with possible Crohn's disease. The patient states that he was seen in [place] for similar reasons but never underwent a colonoscopy or any endoscopic procedure. The patient was put on steroids and given pain medication and Reglan. The patient also reports that he is lost a few pounds without trying. The patient reports that the abdominal pain is a crampy pain that is in the lower abdomen on both sides. The patient also reports that he has been having diarrhea with blood in his stool.  Past Medical History:  Diagnosis Date  . Colitis   . Complication of anesthesia    Prolonged Emergence  . Tonsillitis     Past Surgical History:  Procedure Laterality Date  . INCISION AND DRAINAGE PERIRECTAL ABSCESS N/A 04/19/2015   Procedure: IRRIGATION AND DEBRIDEMENT PERIRECTAL ABSCESS;  Surgeon: Florene Glen, MD;  Location: [facility] ORS;  Service: General;  Laterality: N/A;  . INCISION AND DRAINAGE PERIRECTAL ABSCESS  12-2014  . TONSILLECTOMY N/A 10/04/2015   Procedure:  control of TONSILLECTOMY hemorrage ;  Surgeon: Carloyn Manner, MD;  Location: [facility] ORS;  Service: ENT;  Laterality: N/A;  . TONSILLECTOMY AND ADENOIDECTOMY N/A 10/02/2015   Procedure: TONSILLECTOMY AND ADENOIDECTOMY;  Surgeon: Beverly Gust, MD;  Location: [facility] ORS;  Service: ENT;  Laterality: N/A;  . TONSILLECTOMY AND ADENOIDECTOMY N/A 10/04/2015   Procedure: control TONSILLECTOMY hemorrage;  Surgeon: Clyde Canterbury, MD;  Location: [facility] ORS;  Service: ENT;  Laterality: N/A;    Prior to Admission medications   Medication Sig Start Date End Date Taking? Authorizing Provider  albuterol (PROVENTIL) (2.5 MG/3ML) 0.083% nebulizer solution Frequency:UNKNOWN   Dosage:0.0     Instructions:  Note:Dose: .83MG /ML 06/20/03   Historical Provider, MD  dicyclomine (BENTYL) 20 MG tablet Take 1 tablet (20 mg total) by mouth every 6 (six) hours as needed. 01/08/17   Paulette Blanch, MD  HYDROcodone-acetaminophen (NORCO/VICODIN) 5-325 MG tablet Take 1 tablet by mouth every 6 (six) hours as needed for moderate pain. 01/06/17   [name], MD  metoCLOPramide (REGLAN) 10 MG tablet Take 1 tablet (10 mg total) by mouth 3 (three) times daily with meals. 01/06/17 01/06/18  Rudene Re, MD  naproxen (NAPROSYN) 500 MG tablet Take 1 tablet (500 mg total) by mouth 2 (two) times daily with a meal. 02/19/16   Carrie Mew, MD  ondansetron (ZOFRAN ODT) 4 MG disintegrating tablet Take 1 tablet (4 mg total) by mouth every 8 (eight) hours as needed for nausea or vomiting. 01/06/17   [name], MD  ondansetron (ZOFRAN ODT) 8 MG disintegrating tablet Take 1 tablet (8 mg total) by mouth every 8 (eight) hours as needed for nausea or vomiting. 02/19/16   Carrie Mew, MD  oxyCODONE (ROXICODONE) 5 MG/5ML solution Take 5 mLs (5 mg total) by mouth every 4 (four) hours as needed for moderate  pain. Patient not taking: Reported on 10/12/2015 10/07/15   Carloyn Manner, MD  oxyCODONE-acetaminophen (PERCOCET) 10-325 MG tablet Take 1 tablet by mouth every 6 (six) hours as needed for pain. 01/08/17   Paulette Blanch, MD  polyethylene glycol (GOLYTELY) 236 g solution Drink one 8 oz glass every 20 mins until stools are clear 01/13/17   Lucilla Lame, MD  predniSONE (STERAPRED UNI-PAK 21 TAB) 10 MG (21) TBPK tablet Dispense steroid taper pack as instructed 01/06/17   [name], MD    ranitidine (ZANTAC) 150 MG capsule Take 2 capsules (300 mg total) by mouth 2 (two) times daily. 02/19/16   Carrie Mew, MD    History reviewed. No pertinent family history.   Social History  Substance Use Topics  . Smoking status: Never Smoker  . Smokeless tobacco: Never Used  . Alcohol use No    Allergies as of 01/13/2017 - Review Complete 01/13/2017  Allergen Reaction Noted  . Doxycycline Rash 04/17/2015  . Zofran [ondansetron hcl] Nausea And Vomiting and Rash 01/07/2017    Review of Systems:    All systems reviewed and negative except where noted in HPI.   Physical Exam:  BP (!) 144/81   Pulse (!) 101   Temp 98.9 F (37.2 C) (Oral)   Ht 6\' 1"  (1.854 m)   Wt (!) 328 lb (148.8 kg)   BMI 43.27 kg/m  No LMP for male patient. Psych:  Alert and cooperative. Normal mood and affect. General:   Alert,  Well-developed, well-nourished, pleasant and cooperative in NAD Head:  Normocephalic and atraumatic. Eyes:  Sclera clear, no icterus.   Conjunctiva pink. Ears:  Normal auditory acuity. Nose:  No deformity, discharge, or lesions. Mouth:  No deformity or lesions,oropharynx pink & moist. Neck:  Supple; no masses or thyromegaly. Lungs:  Respirations even and unlabored.  Clear throughout to auscultation.   No wheezes, crackles, or rhonchi. No acute distress. Heart:  Regular rate and rhythm; no murmurs, clicks, rubs, or gallops. Abdomen:  Normal bowel sounds.  No bruits.  Soft, non-tender and non-distended without masses, hepatosplenomegaly or hernias noted.  No guarding or rebound tenderness.  Negative Carnett sign.   Rectal:  Deferred.  Msk:  Symmetrical without gross deformities.  Good, equal movement & strength bilaterally. Pulses:  Normal pulses noted. Extremities:  No clubbing or edema.  No cyanosis. Neurologic:  Alert and oriented x3;  grossly normal neurologically. Skin:  Intact without significant lesions or rashes.  No jaundice. Lymph Nodes:  No significant cervical  adenopathy. Psych:  Alert and cooperative. Normal mood and affect.  Imaging Studies: Ct Abdomen Pelvis W Contrast  Result Date: 01/06/2017 CLINICAL DATA:  21 year old male with abdominal pain and blood in stools for the past 2 days. Recent diagnosis of Crohn's disease. History of perirectal abscess 2016. Initial encounter. EXAM: CT ABDOMEN AND PELVIS WITH CONTRAST TECHNIQUE: Multidetector CT imaging of the abdomen and pelvis was performed using the standard protocol following bolus administration of intravenous contrast. CONTRAST:  12mL ISOVUE-300 IOPAMIDOL (ISOVUE-300) INJECTION 61% COMPARISON:  12/03/2014 CT abdomen and pelvis. FINDINGS: Lower chest: Minimal basilar atelectasis. Heart size within normal limits. Hepatobiliary: Suspect mild fatty infiltration. No worrisome mass identified. No calcified gallstones. Pancreas: No mass or inflammation. Spleen: No mass or enlargement. Adrenals/Urinary Tract: No hydronephrosis. No renal or adrenal mass. Stomach/Bowel: No extra luminal bowel inflammatory process or free air. Majority of colon underdistended and evaluation limited There is however, circumferential thickening of the colon at the level of the hepatic flexure  which may indicate changes of Crohn's disease whether remote or active without extra luminal inflammation. No inflammation surrounds the appendix or terminal ileum. No perirectal abnormality noted in this patient who had prior peri rectal abscess. Few dilated small bowel loops upper abdomen without point of obstruction may be fortuitous finding. Vascular/Lymphatic: No large vessel occlusion or aneurysm. No adenopathy. Reproductive: Prostate gland unremarkable. Noncontrast filled views the urinary bladder unremarkable. Other: Negative. Musculoskeletal: No worrisome osseous abnormality. IMPRESSION: No extra luminal bowel inflammatory process or free air. Majority of colon underdistended and evaluation limited. There is however, circumferential  thickening of the colon at the level of the hepatic flexure which may indicate changes of Crohn's disease whether remote or active without extra luminal inflammation. No inflammation surrounds the appendix or terminal ileum. No perirectal abnormality noted in this patient who had prior peri rectal abscess. Electronically Signed   By: Genia Del M.D.   On: 01/06/2017 15:17    Assessment and Plan:   Ronnie Smith is a 21 y.o. y/o male who was in the emergency department recently and told she had Crohn's disease but denies any history of a luminal examination with biopsies. The patient will be set up for a colonoscopy to evaluate for possible Crohn's disease. I have discussed risks & benefits which include, but are not limited to, bleeding, infection, perforation & drug reaction.  The patient agrees with this plan & written consent will be obtained.       Lucilla Lame, MD. Marval Regal   Note: This dictation was prepared with Dragon dictation along with smaller phrase technology. Any transcriptional errors that result from this process are unintentional.

## 2017-01-14 ENCOUNTER — Encounter: Payer: Self-pay | Admitting: *Deleted

## 2017-01-14 ENCOUNTER — Ambulatory Visit: Payer: Self-pay | Admitting: Gastroenterology

## 2017-01-14 NOTE — Discharge Instructions (Signed)

## 2017-01-15 ENCOUNTER — Ambulatory Visit
Admission: RE | Admit: 2017-01-15 | Discharge: 2017-01-15 | Disposition: A | Discharge status: Home / Self Care | Payer: BC Managed Care – PPO | Source: Ambulatory Visit | Attending: Gastroenterology | Admitting: Gastroenterology

## 2017-01-15 ENCOUNTER — Ambulatory Visit: Payer: BC Managed Care – PPO | Admitting: Anesthesiology

## 2017-01-15 ENCOUNTER — Encounter
Admission: RE | Disposition: A | Discharge status: Home / Self Care | Payer: Self-pay | Source: Ambulatory Visit | Attending: Gastroenterology

## 2017-01-15 DIAGNOSIS — Z888 Allergy status to other drugs, medicaments and biological substances status: Secondary | ICD-10-CM | POA: Insufficient documentation

## 2017-01-15 DIAGNOSIS — K529 Noninfective gastroenteritis and colitis, unspecified: Secondary | ICD-10-CM

## 2017-01-15 DIAGNOSIS — Z79899 Other long term (current) drug therapy: Secondary | ICD-10-CM | POA: Diagnosis not present

## 2017-01-15 DIAGNOSIS — K6282 Dysplasia of anus: Secondary | ICD-10-CM | POA: Diagnosis not present

## 2017-01-15 DIAGNOSIS — K6289 Other specified diseases of anus and rectum: Secondary | ICD-10-CM

## 2017-01-15 DIAGNOSIS — R1084 Generalized abdominal pain: Secondary | ICD-10-CM

## 2017-01-15 DIAGNOSIS — Z6841 Body Mass Index (BMI) 40.0 and over, adult: Secondary | ICD-10-CM | POA: Insufficient documentation

## 2017-01-15 DIAGNOSIS — A630 Anogenital (venereal) warts: Secondary | ICD-10-CM | POA: Insufficient documentation

## 2017-01-15 DIAGNOSIS — Z881 Allergy status to other antibiotic agents status: Secondary | ICD-10-CM | POA: Insufficient documentation

## 2017-01-15 DIAGNOSIS — I10 Essential (primary) hypertension: Secondary | ICD-10-CM | POA: Diagnosis not present

## 2017-01-15 DIAGNOSIS — K921 Melena: Secondary | ICD-10-CM | POA: Diagnosis not present

## 2017-01-15 HISTORY — PX: COLONOSCOPY WITH PROPOFOL: SHX5780

## 2017-01-15 HISTORY — PX: POLYPECTOMY: SHX5525

## 2017-01-15 SURGERY — COLONOSCOPY WITH PROPOFOL
Anesthesia: Monitor Anesthesia Care | Site: Rectum | Wound class: Contaminated

## 2017-01-15 MED ORDER — LACTATED RINGERS IV SOLN
INTRAVENOUS | Status: DC
  Administered 2017-01-15: 09:00:00 via INTRAVENOUS

## 2017-01-15 MED ORDER — STERILE WATER FOR IRRIGATION IR SOLN
Status: DC | PRN
  Administered 2017-01-15: 10:00:00

## 2017-01-15 MED ORDER — PROPOFOL 10 MG/ML IV BOLUS
INTRAVENOUS | Status: DC | PRN
  Administered 2017-01-15: 50 mg via INTRAVENOUS
  Administered 2017-01-15: 20 mg via INTRAVENOUS
  Administered 2017-01-15: 40 mg via INTRAVENOUS
  Administered 2017-01-15: 150 mg via INTRAVENOUS
  Administered 2017-01-15: 40 mg via INTRAVENOUS
  Administered 2017-01-15: 20 mg via INTRAVENOUS

## 2017-01-15 MED ORDER — LIDOCAINE HCL (CARDIAC) 20 MG/ML IV SOLN
INTRAVENOUS | Status: DC | PRN
  Administered 2017-01-15: 50 mg via INTRAVENOUS

## 2017-01-15 MED ORDER — ACETAMINOPHEN 325 MG PO TABS: 325.0000 mg | ORAL_TABLET | ORAL | Status: DC | PRN

## 2017-01-15 MED ORDER — ACETAMINOPHEN 160 MG/5ML PO SOLN: 325.0000 mg | ORAL | Status: DC | PRN

## 2017-01-15 SURGICAL SUPPLY — 23 items

## 2017-01-15 NOTE — Transfer of Care (Signed)
Immediate Anesthesia Transfer of Care Note  Patient: Ronnie Smith.  Procedure(s) Performed: Procedure(s): COLONOSCOPY WITH PROPOFOL (N/A) POLYPECTOMY  Patient Location: PACU  Anesthesia Type: MAC  Level of Consciousness: awake, alert  and patient cooperative  Airway and Oxygen Therapy: Patient Spontanous Breathing and Patient connected to supplemental oxygen  Post-op Assessment: Post-op Vital signs reviewed, Patient's Cardiovascular Status Stable, Respiratory Function Stable, Patent Airway and No signs of Nausea or vomiting  Post-op Vital Signs: Reviewed and stable  Complications: No apparent anesthesia complications

## 2017-01-15 NOTE — Anesthesia Procedure Notes (Signed)
Procedure Name: MAC Date/Time: 01/15/2017 9:29 AM Performed by: Cameron Ali Pre-anesthesia Checklist: Patient identified, Emergency Drugs available, Suction available, Timeout performed and Patient being monitored Patient Re-evaluated:Patient Re-evaluated prior to inductionOxygen Delivery Method: Nasal cannula Placement Confirmation: positive ETCO2

## 2017-01-15 NOTE — Anesthesia Preprocedure Evaluation (Signed)
Anesthesia Evaluation  Patient identified by MRN, date of birth, ID band Patient awake    Reviewed: Allergy & Precautions, H&P , NPO status , Patient's Chart, lab work & pertinent test results, reviewed documented beta blocker date and time   History of Anesthesia Complications (+) PROLONGED EMERGENCE and history of anesthetic complications  Airway Mallampati: I  TM Distance: >3 FB Neck ROM: full    Dental no notable dental hx.    Pulmonary neg pulmonary ROS,    Pulmonary exam normal breath sounds clear to auscultation       Cardiovascular Exercise Tolerance: Good hypertension, Normal cardiovascular exam Rhythm:regular Rate:Normal     Neuro/Psych negative neurological ROS  negative psych ROS   GI/Hepatic Neg liver ROS, Hx of colitis   Endo/Other  Morbid obesity  Renal/GU negative Renal ROS  negative genitourinary   Musculoskeletal   Abdominal   Peds  Hematology negative hematology ROS (+)   Anesthesia Other Findings   Reproductive/Obstetrics negative OB ROS                             Anesthesia Physical Anesthesia Plan  ASA: II  Anesthesia Plan: MAC   Post-op Pain Management:    Induction:   Airway Management Planned:   Additional Equipment:   Intra-op Plan:   Post-operative Plan:   Informed Consent: I have reviewed the patients History and Physical, chart, labs and discussed the procedure including the risks, benefits and alternatives for the proposed anesthesia with the patient or authorized representative who has indicated his/her understanding and acceptance.   Dental Advisory Given  Plan Discussed with: CRNA  Anesthesia Plan Comments:         Anesthesia Quick Evaluation

## 2017-01-15 NOTE — Op Note (Signed)
[facility] Patient Name: Ronnie Smith Procedure Date: 01/15/2017 9:26 AM MRN: [id number] Account #: [id number] Date of Birth: June 22, 1996 Admit Type: Outpatient Age: 21 Room: [facility] OR ROOM 01 Gender: Male Note Status: Finalized Procedure:            Colonoscopy Indications:          Generalized abdominal pain, Chronic diarrhea,                        Hematochezia Providers:            Lucilla Lame MD, MD Referring MD:         Caprice Renshaw MD (Referring MD) Medicines:            Propofol per Anesthesia Complications:        No immediate complications. Procedure:            Pre-Anesthesia Assessment:                       - Prior to the procedure, a History and Physical was                        performed, and patient medications and allergies were                        reviewed. The patient's tolerance of previous                        anesthesia was also reviewed. The risks and benefits of                        the procedure and the sedation options and risks were                        discussed with the patient. All questions were                        answered, and informed consent was obtained. Prior                        Anticoagulants: The patient has taken no previous                        anticoagulant or antiplatelet agents. ASA Grade                        Assessment: II - A patient with mild systemic disease.                        After reviewing the risks and benefits, the patient was                        deemed in satisfactory condition to undergo the                        procedure.                       After obtaining informed consent, the colonoscope was  passed under direct vision. Throughout the procedure,                        the patient's blood pressure, pulse, and oxygen                        saturations were monitored continuously. The Olympus                        Colonoscope 190  [id number]) was introduced through the                        anus and advanced to the the terminal ileum. The                        colonoscopy was performed without difficulty. The                        patient tolerated the procedure well. The quality of                        the bowel preparation was poor. Findings:      The perianal and digital rectal examinations were normal.      The terminal ileum appeared normal. Biopsies were taken with a cold       forceps for histology.      A localized area of plaque covered mucosa was found in the rectum.       Biopsies were taken with a cold forceps for histology. Impression:           - Preparation of the colon was poor.                       - The examined portion of the ileum was normal.                        Biopsied.                       - Plaque covered mucosa in the rectum. Biopsied. Recommendation:       - Discharge patient to home.                       - Resume previous diet.                       - Continue present medications.                       - Await pathology results. Procedure Code(s):    --- Professional ---                       [phone], Colonoscopy, flexible; with biopsy, single or                        multiple Diagnosis Code(s):    --- Professional ---                       R10.84, Generalized abdominal pain                       K92.1, Melena (  includes Hematochezia)                       K52.9, Noninfective gastroenteritis and colitis,                        unspecified                       K62.89, Other specified diseases of anus and rectum CPT copyright 2016 American Medical Association. All rights reserved. The codes documented in this report are preliminary and upon coder review may  be revised to meet current compliance requirements. Lucilla Lame MD, MD 01/15/2017 9:50:41 AM This report has been signed electronically. Number of Addenda: 0 Note Initiated On: 01/15/2017 9:26 AM Scope Withdrawal Time: 0  hours 10 minutes 5 seconds  Total Procedure Duration: 0 hours 13 minutes 24 seconds       [facility]

## 2017-01-15 NOTE — H&P (Signed)
Ronnie Lame, MD [address] [place], [place] [address] Phone: [phone] Fax : [phone]  Primary Care Physician:  Marcello Fennel, MD Primary Gastroenterologist:  Dr. Allen Norris  Pre-Procedure History & Physical: HPI:  Cyrus Kolpack. is a 21 y.o. male is here for an colonoscopy.   Past Medical History:  Diagnosis Date  . Colitis   . Complication of anesthesia    Prolonged Emergence  . Tonsillitis     Past Surgical History:  Procedure Laterality Date  . INCISION AND DRAINAGE PERIRECTAL ABSCESS N/A 04/19/2015   Procedure: IRRIGATION AND DEBRIDEMENT PERIRECTAL ABSCESS;  Surgeon: Florene Glen, MD;  Location: [facility];  Service: General;  Laterality: N/A;  . INCISION AND DRAINAGE PERIRECTAL ABSCESS  12-2014  . TONSILLECTOMY N/A 10/04/2015   Procedure:  control of TONSILLECTOMY hemorrage ;  Surgeon: Carloyn Manner, MD;  Location: [facility];  Service: ENT;  Laterality: N/A;  . TONSILLECTOMY AND ADENOIDECTOMY N/A 10/02/2015   Procedure: TONSILLECTOMY AND ADENOIDECTOMY;  Surgeon: Beverly Gust, MD;  Location: [facility];  Service: ENT;  Laterality: N/A;  . TONSILLECTOMY AND ADENOIDECTOMY N/A 10/04/2015   Procedure: control TONSILLECTOMY hemorrage;  Surgeon: Clyde Canterbury, MD;  Location: [facility];  Service: ENT;  Laterality: N/A;    Prior to Admission medications   Medication Sig Start Date End Date Taking? Authorizing Provider  albuterol (PROVENTIL) (2.5 MG/3ML) 0.083% nebulizer solution Frequency:UNKNOWN   Dosage:0.0     Instructions:  Note:Dose: .83MG /ML 06/20/03  Yes Historical Provider, MD  dicyclomine (BENTYL) 20 MG tablet Take 1 tablet (20 mg total) by mouth every 6 (six) hours as needed. 01/08/17  Yes Paulette Blanch, MD  HYDROcodone-acetaminophen (NORCO/VICODIN) 5-325 MG tablet Take 1 tablet by mouth every 6 (six) hours as needed for moderate pain. 01/06/17  Yes [name], MD  metoCLOPramide (REGLAN) 10 MG tablet Take 1 tablet (10 mg total) by mouth 3  (three) times daily with meals. 01/06/17 01/06/18 Yes Rudene Re, MD  naproxen (NAPROSYN) 500 MG tablet Take 1 tablet (500 mg total) by mouth 2 (two) times daily with a meal. 02/19/16  Yes Carrie Mew, MD  oxyCODONE-acetaminophen (PERCOCET) 10-325 MG tablet Take 1 tablet by mouth every 6 (six) hours as needed for pain. 01/08/17  Yes Paulette Blanch, MD  polyethylene glycol (GOLYTELY) 236 g solution Drink one 8 oz glass every 20 mins until stools are clear 01/13/17  Yes Ronnie Lame, MD  ondansetron (ZOFRAN ODT) 4 MG disintegrating tablet Take 1 tablet (4 mg total) by mouth every 8 (eight) hours as needed for nausea or vomiting. Patient not taking: Reported on 01/14/2017 01/06/17   [name], MD  ondansetron (ZOFRAN ODT) 8 MG disintegrating tablet Take 1 tablet (8 mg total) by mouth every 8 (eight) hours as needed for nausea or vomiting. Patient not taking: Reported on 01/14/2017 02/19/16   Carrie Mew, MD  oxyCODONE (ROXICODONE) 5 MG/5ML solution Take 5 mLs (5 mg total) by mouth every 4 (four) hours as needed for moderate pain. Patient not taking: Reported on 10/12/2015 10/07/15   Carloyn Manner, MD    Allergies as of 01/13/2017 - Review Complete 01/13/2017  Allergen Reaction Noted  . Doxycycline Rash 04/17/2015  . Zofran [ondansetron hcl] Nausea And Vomiting and Rash 01/07/2017    History reviewed. No pertinent family history.  Social History   Social History  . Marital status: Single    Spouse name: N/A  . Number of children: N/A  . Years of education: N/A  Occupational History  . Not on file.   Social History Main Topics  . Smoking status: Never Smoker  . Smokeless tobacco: Never Used  . Alcohol use No  . Drug use: No  . Sexual activity: Not on file   Other Topics Concern  . Not on file   Social History Narrative  . No narrative on file    Review of Systems: See HPI, otherwise negative ROS  Physical Exam: BP (!) 143/91   Pulse 85   Temp 97.7 F (36.5  C) (Tympanic)   Resp 16   Ht 6\' 1"  (1.854 m)   Wt (!) 315 lb (142.9 kg)   SpO2 99%   BMI 41.56 kg/m  General:   Alert,  pleasant and cooperative in NAD Head:  Normocephalic and atraumatic. Neck:  Supple; no masses or thyromegaly. Lungs:  Clear throughout to auscultation.    Heart:  Regular rate and rhythm. Abdomen:  Soft, nontender and nondistended. Normal bowel sounds, without guarding, and without rebound.   Neurologic:  Alert and  oriented x4;  grossly normal neurologically.  Impression/Plan: Adelina Mings. is here for an colonoscopy to be performed for abd pain and diarrhea  Risks, benefits, limitations, and alternatives regarding  colonoscopy have been reviewed with the patient.  Questions have been answered.  All parties agreeable.   Ronnie Lame, MD  01/15/2017, 8:53 AM

## 2017-01-15 NOTE — Anesthesia Postprocedure Evaluation (Signed)
Anesthesia Post Note  Patient: Ronnie Smith.  Procedure(s) Performed: Procedure(s) (LRB): COLONOSCOPY WITH PROPOFOL (N/A) POLYPECTOMY  Patient location during evaluation: PACU Anesthesia Type: MAC Level of consciousness: awake and alert Pain management: pain level controlled Vital Signs Assessment: post-procedure vital signs reviewed and stable Respiratory status: spontaneous breathing, nonlabored ventilation, respiratory function stable and patient connected to nasal cannula oxygen Cardiovascular status: stable and blood pressure returned to baseline Anesthetic complications: no    Trecia Rogers

## 2017-01-16 ENCOUNTER — Encounter: Payer: Self-pay | Admitting: Gastroenterology

## 2017-01-17 ENCOUNTER — Encounter: Payer: Self-pay | Admitting: Emergency Medicine

## 2017-01-17 ENCOUNTER — Emergency Department: Payer: BC Managed Care – PPO

## 2017-01-17 ENCOUNTER — Inpatient Hospital Stay
Admission: EM | Admit: 2017-01-17 | Discharge: 2017-01-20 | DRG: 872 | Disposition: A | Discharge status: Home / Self Care | Payer: BC Managed Care – PPO | Attending: Surgery | Admitting: Surgery

## 2017-01-17 DIAGNOSIS — K602 Anal fissure, unspecified: Secondary | ICD-10-CM | POA: Diagnosis present

## 2017-01-17 DIAGNOSIS — K612 Anorectal abscess: Secondary | ICD-10-CM | POA: Diagnosis present

## 2017-01-17 DIAGNOSIS — K610 Anal abscess: Secondary | ICD-10-CM

## 2017-01-17 DIAGNOSIS — Z888 Allergy status to other drugs, medicaments and biological substances status: Secondary | ICD-10-CM

## 2017-01-17 DIAGNOSIS — Z6841 Body Mass Index (BMI) 40.0 and over, adult: Secondary | ICD-10-CM

## 2017-01-17 DIAGNOSIS — R6510 Systemic inflammatory response syndrome (SIRS) of non-infectious origin without acute organ dysfunction: Secondary | ICD-10-CM

## 2017-01-17 DIAGNOSIS — Z881 Allergy status to other antibiotic agents status: Secondary | ICD-10-CM

## 2017-01-17 DIAGNOSIS — A419 Sepsis, unspecified organism: Principal | ICD-10-CM | POA: Diagnosis present

## 2017-01-17 DIAGNOSIS — K611 Rectal abscess: Secondary | ICD-10-CM | POA: Diagnosis present

## 2017-01-17 LAB — COMPREHENSIVE METABOLIC PANEL
ALK PHOS: 77 U/L (ref 38–126)
ALT: 22 U/L (ref 17–63)
AST: 22 U/L (ref 15–41)
Albumin: 4.6 g/dL (ref 3.5–5.0)
Anion gap: 11 (ref 5–15)
BUN: 16 mg/dL (ref 6–20)
CHLORIDE: 106 mmol/L (ref 101–111)
CO2: 21 mmol/L — ABNORMAL LOW (ref 22–32)
CREATININE: 1.14 mg/dL (ref 0.61–1.24)
Calcium: 9.2 mg/dL (ref 8.9–10.3)
Glucose, Bld: 123 mg/dL — ABNORMAL HIGH (ref 65–99)
Potassium: 3.9 mmol/L (ref 3.5–5.1)
Sodium: 138 mmol/L (ref 135–145)
Total Bilirubin: 0.7 mg/dL (ref 0.3–1.2)
Total Protein: 8 g/dL (ref 6.5–8.1)

## 2017-01-17 LAB — BLOOD GAS, VENOUS
Acid-base deficit: 0.4 mmol/L (ref 0.0–2.0)
BICARBONATE: 23.1 mmol/L (ref 20.0–28.0)
O2 Saturation: 90.3 %
PH VEN: 7.44 — AB (ref 7.250–7.430)
PO2 VEN: 57 mmHg — AB (ref 32.0–45.0)
Patient temperature: 37
pCO2, Ven: 34 mmHg — ABNORMAL LOW (ref 44.0–60.0)

## 2017-01-17 LAB — LACTIC ACID, PLASMA
LACTIC ACID, VENOUS: 1.5 mmol/L (ref 0.5–1.9)
Lactic Acid, Venous: 2.1 mmol/L (ref 0.5–1.9)

## 2017-01-17 LAB — CBC WITH DIFFERENTIAL/PLATELET
BASOS ABS: 0.1 10*3/uL (ref 0–0.1)
BASOS PCT: 1 %
EOS ABS: 0.1 10*3/uL (ref 0–0.7)
EOS PCT: 0 %
HCT: 46.9 % (ref 40.0–52.0)
Hemoglobin: 15.5 g/dL (ref 13.0–18.0)
Lymphocytes Relative: 17 %
Lymphs Abs: 3.5 10*3/uL (ref 1.0–3.6)
MCH: 27.4 pg (ref 26.0–34.0)
MCHC: 32.9 g/dL (ref 32.0–36.0)
MCV: 83.1 fL (ref 80.0–100.0)
Monocytes Absolute: 1.4 10*3/uL — ABNORMAL HIGH (ref 0.2–1.0)
Monocytes Relative: 7 %
Neutro Abs: 16.1 10*3/uL — ABNORMAL HIGH (ref 1.4–6.5)
Neutrophils Relative %: 75 %
PLATELETS: 432 10*3/uL (ref 150–440)
RBC: 5.65 MIL/uL (ref 4.40–5.90)
RDW: 13.7 % (ref 11.5–14.5)
WBC: 21.2 10*3/uL — AB (ref 3.8–10.6)

## 2017-01-17 LAB — URINALYSIS, COMPLETE (UACMP) WITH MICROSCOPIC
BACTERIA UA: NONE SEEN
BILIRUBIN URINE: NEGATIVE
GLUCOSE, UA: NEGATIVE mg/dL
HGB URINE DIPSTICK: NEGATIVE
Ketones, ur: NEGATIVE mg/dL
LEUKOCYTES UA: NEGATIVE
NITRITE: NEGATIVE
PROTEIN: NEGATIVE mg/dL
Specific Gravity, Urine: 1.018 (ref 1.005–1.030)
pH: 7 (ref 5.0–8.0)

## 2017-01-17 LAB — INFLUENZA PANEL BY PCR (TYPE A & B)
Influenza A By PCR: NEGATIVE
Influenza B By PCR: NEGATIVE

## 2017-01-17 LAB — TROPONIN I: Troponin I: <0.03 ng/mL (ref <0.03)

## 2017-01-17 MED ORDER — SODIUM CHLORIDE 0.9 % IV BOLUS (SEPSIS)
1000.0000 mL | Freq: Once | INTRAVENOUS | Status: AC
  Administered 2017-01-17: 1000 mL via INTRAVENOUS

## 2017-01-17 MED ORDER — ACETAMINOPHEN 500 MG PO TABS
ORAL_TABLET | ORAL | Status: AC
  Filled 2017-01-17: qty 1

## 2017-01-17 MED ORDER — PIPERACILLIN-TAZOBACTAM 4.5 G IVPB
4.5000 g | Freq: Three times a day (TID) | INTRAVENOUS | Status: DC
  Administered 2017-01-18 – 2017-01-20 (×6): 4.5 g via INTRAVENOUS
  Filled 2017-01-17 (×10): qty 100

## 2017-01-17 MED ORDER — SODIUM CHLORIDE 0.9 % IV BOLUS (SEPSIS)
500.0000 mL | Freq: Once | INTRAVENOUS | Status: AC
  Administered 2017-01-17: 500 mL via INTRAVENOUS

## 2017-01-17 MED ORDER — IOPAMIDOL (ISOVUE-300) INJECTION 61%
125.0000 mL | Freq: Once | INTRAVENOUS | Status: AC | PRN
  Administered 2017-01-17: 125 mL via INTRAVENOUS

## 2017-01-17 MED ORDER — IOPAMIDOL (ISOVUE-300) INJECTION 61%
15.0000 mL | INTRAVENOUS | Status: AC
  Administered 2017-01-17: 15 mL via ORAL

## 2017-01-17 MED ORDER — SODIUM CHLORIDE 0.9 % IV SOLN: 1250.0000 mg | Freq: Three times a day (TID) | INTRAVENOUS | Status: DC

## 2017-01-17 MED ORDER — PIPERACILLIN-TAZOBACTAM 4.5 G IVPB: 4.5000 g | Freq: Three times a day (TID) | INTRAVENOUS | Status: DC

## 2017-01-17 MED ORDER — PIPERACILLIN-TAZOBACTAM 3.375 G IVPB 30 MIN
3.3750 g | Freq: Once | INTRAVENOUS | Status: AC
  Administered 2017-01-17: 3.375 g via INTRAVENOUS
  Filled 2017-01-17: qty 50

## 2017-01-17 MED ORDER — VANCOMYCIN HCL IN DEXTROSE 1-5 GM/200ML-% IV SOLN
1000.0000 mg | Freq: Once | INTRAVENOUS | Status: AC
  Administered 2017-01-17: 1000 mg via INTRAVENOUS
  Filled 2017-01-17: qty 200

## 2017-01-17 MED ORDER — ACETAMINOPHEN 500 MG PO TABS
1000.0000 mg | ORAL_TABLET | Freq: Once | ORAL | Status: AC
  Administered 2017-01-17: 1000 mg via ORAL
  Filled 2017-01-17: qty 2

## 2017-01-17 MED ORDER — SODIUM CHLORIDE 0.9 % IV BOLUS (SEPSIS): 2000.0000 mL | INTRAVENOUS | Status: DC

## 2017-01-17 NOTE — H&P (Signed)
Ronnie Smith. is an 21 y.o. male.   Chief Complaint: 2 day history of rectal pain, past history of same. HPI: 21 year old male reportedly told he had Crohn's disease based on imaging studies while living in [place] for about 9 months in 2017. Several trips to the ED for evaluation of abdominal pain or recently. Due to a history, dating back prior to his 01/15/2017 colonoscopy, worsening over the last 48 hours. Associated with fever and chills.  The patient reports he was told he had Crohn's disease and was placed on medication which he is unsure of. He thinks that steroids were prescribed, although the most recent steroid taper prescription was provided through the emergency department on January 30... He did not see a GI physician while living in [place]. Recently evaluated by Dr.Wohl.    The patient underwent incision and drainage of a left sided perirectal abscess in May 2016 under the care of Phoebe Perch, M.D.  Past Medical History:  Diagnosis Date  . Colitis   . Complication of anesthesia    Prolonged Emergence  . Tonsillitis     Past Surgical History:  Procedure Laterality Date  . COLONOSCOPY WITH PROPOFOL N/A 01/15/2017   Procedure: COLONOSCOPY WITH PROPOFOL;  Surgeon: Lucilla Lame, MD;  Location: [facility];  Service: Endoscopy;  Laterality: N/A;  . INCISION AND DRAINAGE PERIRECTAL ABSCESS N/A 04/19/2015   Procedure: IRRIGATION AND DEBRIDEMENT PERIRECTAL ABSCESS;  Surgeon: Florene Glen, MD;  Location: [facility];  Service: General;  Laterality: N/A;  . INCISION AND DRAINAGE PERIRECTAL ABSCESS  12-2014  . POLYPECTOMY  01/15/2017   Procedure: POLYPECTOMY;  Surgeon: Lucilla Lame, MD;  Location: [facility];  Service: Endoscopy;;  . TONSILLECTOMY N/A 10/04/2015   Procedure:  control of TONSILLECTOMY hemorrage ;  Surgeon: Carloyn Manner, MD;  Location: [facility];  Service: ENT;  Laterality: N/A;  . TONSILLECTOMY AND ADENOIDECTOMY N/A 10/02/2015   Procedure:  TONSILLECTOMY AND ADENOIDECTOMY;  Surgeon: Beverly Gust, MD;  Location: [facility];  Service: ENT;  Laterality: N/A;  . TONSILLECTOMY AND ADENOIDECTOMY N/A 10/04/2015   Procedure: control TONSILLECTOMY hemorrage;  Surgeon: Clyde Canterbury, MD;  Location: [facility];  Service: ENT;  Laterality: N/A;    History reviewed. No pertinent family history. Social History:  reports that he has never smoked. He has never used smokeless tobacco. He reports that he does not drink alcohol or use drugs.  Allergies:  Allergies  Allergen Reactions  . Doxycycline Rash  . Zofran [Ondansetron Hcl] Nausea And Vomiting and Rash     (Not in a hospital admission)  Results for orders placed or performed during the hospital encounter of 01/17/17 (from the past 48 hour(s))  Lactic acid, plasma     Status: Abnormal   Collection Time: 01/17/17  7:04 PM  Result Value Ref Range   Lactic Acid, Venous 2.1 (HH) 0.5 - 1.9 mmol/L    Comment: CRITICAL RESULT CALLED TO, READ BACK BY AND VERIFIED WITH ALLISON PATE AT 1946 01/17/17.PMH  Comprehensive metabolic panel     Status: Abnormal   Collection Time: 01/17/17  7:05 PM  Result Value Ref Range   Sodium 138 135 - 145 mmol/L   Potassium 3.9 3.5 - 5.1 mmol/L   Chloride 106 101 - 111 mmol/L   CO2 21 (L) 22 - 32 mmol/L   Glucose, Bld 123 (H) 65 - 99 mg/dL   BUN 16 6 - 20 mg/dL   Creatinine, Ser 1.14 0.61 - 1.24 mg/dL   Calcium 9.2  8.9 - 10.3 mg/dL   Total Protein 8.0 6.5 - 8.1 g/dL   Albumin 4.6 3.5 - 5.0 g/dL   AST 22 15 - 41 U/L   ALT 22 17 - 63 U/L   Alkaline Phosphatase 77 38 - 126 U/L   Total Bilirubin 0.7 0.3 - 1.2 mg/dL   GFR calc non Af Amer >60 >60 mL/min   GFR calc Af Amer >60 >60 mL/min    Comment: (NOTE) The eGFR has been calculated using the CKD EPI equation. This calculation has not been validated in all clinical situations. eGFR's persistently <60 mL/min signify possible Chronic Kidney Disease.    Anion gap 11 5 - 15  Troponin I     Status: None    Collection Time: 01/17/17  7:05 PM  Result Value Ref Range   Troponin I <0.03 <0.03 ng/mL  CBC WITH DIFFERENTIAL     Status: Abnormal   Collection Time: 01/17/17  7:05 PM  Result Value Ref Range   WBC 21.2 (H) 3.8 - 10.6 K/uL   RBC 5.65 4.40 - 5.90 MIL/uL   Hemoglobin 15.5 13.0 - 18.0 g/dL   HCT 46.9 40.0 - 52.0 %   MCV 83.1 80.0 - 100.0 fL   MCH 27.4 26.0 - 34.0 pg   MCHC 32.9 32.0 - 36.0 g/dL   RDW 13.7 11.5 - 14.5 %   Platelets 432 150 - 440 K/uL   Neutrophils Relative % 75 %   Neutro Abs 16.1 (H) 1.4 - 6.5 K/uL   Lymphocytes Relative 17 %   Lymphs Abs 3.5 1.0 - 3.6 K/uL   Monocytes Relative 7 %   Monocytes Absolute 1.4 (H) 0.2 - 1.0 K/uL   Eosinophils Relative 0 %   Eosinophils Absolute 0.1 0 - 0.7 K/uL   Basophils Relative 1 %   Basophils Absolute 0.1 0 - 0.1 K/uL  Blood gas, venous ([facility], [facility], [facility])     Status: Abnormal   Collection Time: 01/17/17  7:22 PM  Result Value Ref Range   pH, Ven 7.44 (H) 7.250 - 7.430   pCO2, Ven 34 (L) 44.0 - 60.0 mmHg   pO2, Ven 57.0 (H) 32.0 - 45.0 mmHg   Bicarbonate 23.1 20.0 - 28.0 mmol/L   Acid-base deficit 0.4 0.0 - 2.0 mmol/L   O2 Saturation 90.3 %   Patient temperature 37.0    Collection site LINE    Sample type VENOUS   Urinalysis, Complete w Microscopic     Status: Abnormal   Collection Time: 01/17/17  7:22 PM  Result Value Ref Range   Color, Urine YELLOW (A) YELLOW   APPearance CLEAR (A) CLEAR   Specific Gravity, Urine 1.018 1.005 - 1.030   pH 7.0 5.0 - 8.0   Glucose, UA NEGATIVE NEGATIVE mg/dL   Hgb urine dipstick NEGATIVE NEGATIVE   Bilirubin Urine NEGATIVE NEGATIVE   Ketones, ur NEGATIVE NEGATIVE mg/dL   Protein, ur NEGATIVE NEGATIVE mg/dL   Nitrite NEGATIVE NEGATIVE   Leukocytes, UA NEGATIVE NEGATIVE   RBC / HPF 0-5 0 - 5 RBC/hpf   WBC, UA 0-5 0 - 5 WBC/hpf   Bacteria, UA NONE SEEN NONE SEEN   Squamous Epithelial / LPF 0-5 (A) NONE SEEN   Mucous PRESENT   Influenza panel by PCR (type A & B)     Status: None    Collection Time: 01/17/17  7:22 PM  Result Value Ref Range   Influenza A By PCR NEGATIVE NEGATIVE   Influenza B By PCR  NEGATIVE NEGATIVE    Comment: (NOTE) The Xpert Xpress Flu assay is intended as an aid in the diagnosis of  influenza and should not be used as a sole basis for treatment.  This  assay is FDA approved for nasopharyngeal swab specimens only. Nasal  washings and aspirates are unacceptable for Xpert Xpress Flu testing.   Lactic acid, plasma     Status: None   Collection Time: 01/17/17  9:07 PM  Result Value Ref Range   Lactic Acid, Venous 1.5 0.5 - 1.9 mmol/L   Ct Abdomen Pelvis W Contrast  Result Date: 01/17/2017 CLINICAL DATA:  21 y/o M; 4 days of rectal pain with recent colonoscopy and history of rectal abscess. EXAM: CT ABDOMEN AND PELVIS WITH CONTRAST TECHNIQUE: Multidetector CT imaging of the abdomen and pelvis was performed using the standard protocol following bolus administration of intravenous contrast. CONTRAST:  183m ISOVUE-300 IOPAMIDOL (ISOVUE-300) INJECTION 61% COMPARISON:  01/06/2017 CT abdomen and pelvis. FINDINGS: Lower chest: No acute abnormality. Hepatobiliary: No focal liver abnormality is seen. No gallstones, gallbladder wall thickening, or biliary dilatation. Pancreas: Unremarkable. No pancreatic ductal dilatation or surrounding inflammatory changes. Spleen: Normal in size without focal abnormality. Adrenals/Urinary Tract: Adrenal glands are unremarkable. Kidneys are normal, without renal calculi, focal lesion, or hydronephrosis. Bladder is unremarkable. Stomach/Bowel: Stomach is within normal limits. Appendix appears normal. No evidence of bowel wall thickening, distention, or inflammatory changes. Vascular/Lymphatic: No significant vascular findings are present. No enlarged abdominal or pelvic lymph nodes. Reproductive: Prostate is unremarkable. Other: Perianal abscess to the right and anterior of the anus extending from approximately 9:00 to 1:00. The abscess  measures 43 x 33 x 30 mm (AP x ML x CC series 2, image 110 and series 5 image 93). Inflammatory changes throughout the right gluteal fold. The Musculoskeletal: No acute or significant osseous findings. IMPRESSION: Recurrent perianal abscess right and anterior to the anus measuring up to 43 mm with surrounding inflammatory changes of the right gluteal fold. Electronically Signed   By: LKristine GarbeM.D.   On: 01/17/2017 22:36   Dg Chest Port 1 View  Result Date: 01/17/2017 CLINICAL DATA:  Fever congestion EXAM: PORTABLE CHEST 1 VIEW COMPARISON:  None. FINDINGS: The heart size and mediastinal contours are within normal limits. Both lungs are clear. The visualized skeletal structures are unremarkable. IMPRESSION: No active disease. Electronically Signed   By: KDonavan FoilM.D.   On: 01/17/2017 19:43    Review of Systems  Constitutional: Positive for chills, fever and malaise/fatigue.  HENT: Negative.   Eyes: Negative.   Respiratory: Negative.   Cardiovascular: Negative.   Gastrointestinal: Positive for diarrhea.  Genitourinary: Negative.   Musculoskeletal: Negative.   Skin: Negative.   Neurological: Negative.   Endo/Heme/Allergies: Negative.   Psychiatric/Behavioral: Negative.     Blood pressure (!) 108/55, pulse (!) 118, temperature 100.3 F (37.9 C), temperature source Oral, resp. rate (!) 27, weight (!) 315 lb (142.9 kg), SpO2 97 %. Physical Exam  Constitutional: He is oriented to person, place, and time. He appears well-developed and well-nourished.  Cardiovascular: Normal rate and regular rhythm.   Respiratory: Effort normal and breath sounds normal.  Genitourinary:     Neurological: He is alert and oriented to person, place, and time. He has normal reflexes.  Skin: Skin is dry.  Psychiatric: He has a normal mood and affect. His behavior is normal.     Assessment/Plan Patient admitted to the emergency department under the sepsis protocol with a 21,000 white count  and  elevated lactate. He is considered a candidate for emergent incision and drainage of the perirectal abscess.  Patient has been advised against future participation in anal receptive sex based on his past history of previous perirectal abscess drainage in May 2016.Marland Kitchen  Robert Bellow, MD 01/17/2017, 11:36 PM

## 2017-01-17 NOTE — ED Triage Notes (Signed)
Pt to ed with c/o fever, pt diaphoretic at triage.  Fever 103.2 at triage.  Pt states congestion today, HR at triage 160,

## 2017-01-17 NOTE — Progress Notes (Signed)
Pharmacy Antibiotic Note  Ronnie Smith. is a 21 y.o. male admitted on 01/17/2017 with sepsis.  Pharmacy has been consulted for vancomycin and Zosyn dosing.  Plan: DW 105kg  Vd 73L kei 0.1 hr-1  T1/2 7 hours Vancomycin 1250 mg q 8 hours ordered. Not candidate for stacked dosing. Level before 5th overall dose. Goal trough 15-20.  Zosyn 4.5 grams q 8 hours ordered for TBW >120 kg.  Weight: (!) 315 lb (142.9 kg)  Temp (24hrs), Avg:101.3 F (38.5 C), Min:100.3 F (37.9 C), Max:103.2 F (39.6 C)   Recent Labs Lab 01/17/17 1904 01/17/17 1905 01/17/17 2107  WBC  --  21.2*  --   CREATININE  --  1.14  --   LATICACIDVEN 2.1*  --  1.5    Estimated Creatinine Clearance: 152.4 mL/min (by C-G formula based on SCr of 1.14 mg/dL).    Allergies  Allergen Reactions  . Doxycycline Rash  . Zofran [Ondansetron Hcl] Nausea And Vomiting and Rash    Antimicrobials this admission: Zosyn, vancomycin 2/10 >>    >>   Dose adjustments this admission:   Microbiology results: 2/10 BCx: pending    2/10 UA: (-) 2/10 CXR: no active disease  Thank you for allowing pharmacy to be a part of this patient's care.  Luverne Farone S 01/17/2017 10:49 PM

## 2017-01-17 NOTE — ED Notes (Signed)
Pt returned from ct at this time

## 2017-01-17 NOTE — ED Provider Notes (Addendum)
[facility] Emergency Department Provider Note        Time seen: ----------------------------------------- 7:05 PM on 01/17/2017 -----------------------------------------    I have reviewed the triage vital signs and the nursing notes.   HISTORY  Chief Complaint Fever    HPI Ronnie Smith. is a 21 y.o. male who presents to the ER for fever and diaphoresis. Patient rest with him sure of 103.2 here. He said some congestion but most complaint is fevers, chills, body aches with rectal pain. He has not had a bowel movement today. Patient resents also very tachycardic. Reportedly had a recent colonoscopy that showed developing abscesses.   Past Medical History:  Diagnosis Date  . Colitis   . Complication of anesthesia    Prolonged Emergence  . Tonsillitis     Patient Active Problem List   Diagnosis Date Noted  . Abdominal pain, generalized   . Blood in stool   . Noninfectious diarrhea   . Other specified diseases of anus and rectum (CODE)   . Post tonsillectomy secondary hemorrhage 10/05/2015  . HTN (hypertension) 10/05/2015  . Hemorrhage pharyngeal 10/04/2015  . S/P tonsillectomy 10/02/2015  . Perianal abscess 07/19/2015  . Perirectal abscess 04/17/2015    Past Surgical History:  Procedure Laterality Date  . COLONOSCOPY WITH PROPOFOL N/A 01/15/2017   Procedure: COLONOSCOPY WITH PROPOFOL;  Surgeon: Lucilla Lame, MD;  Location: [place];  Service: Endoscopy;  Laterality: N/A;  . INCISION AND DRAINAGE PERIRECTAL ABSCESS N/A 04/19/2015   Procedure: IRRIGATION AND DEBRIDEMENT PERIRECTAL ABSCESS;  Surgeon: Florene Glen, MD;  Location: [facility];  Service: General;  Laterality: N/A;  . INCISION AND DRAINAGE PERIRECTAL ABSCESS  12-2014  . POLYPECTOMY  01/15/2017   Procedure: POLYPECTOMY;  Surgeon: Lucilla Lame, MD;  Location: [place];  Service: Endoscopy;;  . TONSILLECTOMY N/A 10/04/2015   Procedure:  control of TONSILLECTOMY  hemorrage ;  Surgeon: Carloyn Manner, MD;  Location: [facility];  Service: ENT;  Laterality: N/A;  . TONSILLECTOMY AND ADENOIDECTOMY N/A 10/02/2015   Procedure: TONSILLECTOMY AND ADENOIDECTOMY;  Surgeon: Beverly Gust, MD;  Location: [facility];  Service: ENT;  Laterality: N/A;  . TONSILLECTOMY AND ADENOIDECTOMY N/A 10/04/2015   Procedure: control TONSILLECTOMY hemorrage;  Surgeon: Clyde Canterbury, MD;  Location: [facility];  Service: ENT;  Laterality: N/A;    Allergies Doxycycline and Zofran Alvis Lemmings hcl]  Social History Social History  Substance Use Topics  . Smoking status: Never Smoker  . Smokeless tobacco: Never Used  . Alcohol use No    Review of Systems Constitutional: Positive for fevers, chills, body aches Cardiovascular: Negative for chest pain. Respiratory: Negative for shortness of breath. Gastrointestinal: Negative for abdominal pain, vomiting and diarrhea. Positive for rectal pain Genitourinary: Negative for dysuria. Musculoskeletal: Negative for back pain. Skin: Negative for rash. Neurological: Negative for headaches, focal weakness or numbness.  10-point ROS otherwise negative.  ____________________________________________   PHYSICAL EXAM:  VITAL SIGNS: ED Triage Vitals  Enc Vitals Group     BP 01/17/17 1844 120/71     Pulse Rate 01/17/17 1844 (!) 160     Resp 01/17/17 1844 20     Temp 01/17/17 1844 (!) 103.2 F (39.6 C)     Temp Source 01/17/17 1844 Oral     SpO2 01/17/17 1844 98 %     Weight 01/17/17 1844 (!) 315 lb (142.9 kg)     Height --      Head Circumference --      Peak Flow --  Pain Score 01/17/17 1845 10     Pain Loc --      Pain Edu? --      Excl. in [place]? --     Constitutional: Alert and oriented. Mild distress Eyes: Conjunctivae are normal. PERRL. Normal extraocular movements. ENT   Head: Normocephalic and atraumatic.   Nose: No congestion/rhinnorhea.   Mouth/Throat: Mucous membranes are moist.   Neck: No  stridor. Cardiovascular: Rapid rate, regular rhythm. No murmurs, rubs, or gallops. Respiratory: Normal respiratory effort without tachypnea nor retractions. Breath sounds are clear and equal bilaterally. No wheezes/rales/rhonchi. Gastrointestinal: Soft and nontender. Normal bowel sounds Rectal: Perineal area is erythematous and slightly indurated approaching the rectum Musculoskeletal: Nontender with normal range of motion in all extremities. No lower extremity tenderness nor edema. Neurologic:  Normal speech and language. No gross focal neurologic deficits are appreciated.  Skin:  Skin is warm, dry and intact. No rash noted. Psychiatric: Mood and affect are normal. Speech and behavior are normal.  ____________________________________________  EKG: Interpreted by me. Sinus tachycardia with rate 121 bpm, normal PR interval, normal QRS, normal QT interval.  ____________________________________________  ED COURSE:  Pertinent labs & imaging results that were available during my care of the patient were reviewed by me and considered in my medical decision making (see chart for details). Patient presents to ER with symptoms of sepsis or systemic inflammatory response. We will check basic labs, start IV fluids and antibiotics. Clinical Course as of Jan 17 2239  Sat Jan 17, 2017  2002 Lactic Acid, Venous: (!!) 2.1 [JW]  2002 Temp: (!) 103.2 F (39.6 C) [JW]  2002 Pulse Rate: (!) 160 [JW]    Clinical Course User Index [JW] [name], MD   Procedures ____________________________________________   LABS (pertinent positives/negatives)  Labs Reviewed  LACTIC ACID, PLASMA - Abnormal; Notable for the following:       Result Value   Lactic Acid, Venous 2.1 (*)    All other components within normal limits  COMPREHENSIVE METABOLIC PANEL - Abnormal; Notable for the following:    CO2 21 (*)    Glucose, Bld 123 (*)    All other components within normal limits  CBC WITH  DIFFERENTIAL/PLATELET - Abnormal; Notable for the following:    WBC 21.2 (*)    Neutro Abs 16.1 (*)    Monocytes Absolute 1.4 (*)    All other components within normal limits  BLOOD GAS, VENOUS - Abnormal; Notable for the following:    pH, Ven 7.44 (*)    pCO2, Ven 34 (*)    pO2, Ven 57.0 (*)    All other components within normal limits  URINALYSIS, COMPLETE (UACMP) WITH MICROSCOPIC - Abnormal; Notable for the following:    Color, Urine YELLOW (*)    APPearance CLEAR (*)    Squamous Epithelial / LPF 0-5 (*)    All other components within normal limits  CULTURE, BLOOD (ROUTINE X 2)  CULTURE, BLOOD (ROUTINE X 2)  LACTIC ACID, PLASMA  TROPONIN I  INFLUENZA PANEL BY PCR (TYPE A & B)   CRITICAL CARE Performed by: [name]   Total critical care time: 30 minutes  Critical care time was exclusive of separately billable procedures and treating other patients.  Critical care was necessary to treat or prevent imminent or life-threatening deterioration.  Critical care was time spent personally by me on the following activities: development of treatment plan with patient and/or surrogate as well as nursing, discussions with consultants, evaluation of patient's response  to treatment, examination of patient, obtaining history from patient or surrogate, ordering and performing treatments and interventions, ordering and review of laboratory studies, ordering and review of radiographic studies, pulse oximetry and re-evaluation of patient's condition.  RADIOLOGY Images were viewed by me  CT of the abdomen and pelvis with contrast IMPRESSION: Recurrent perianal abscess right and anterior to the anus measuring up to 43 mm with surrounding inflammatory changes of the right gluteal fold.  ____________________________________________  FINAL ASSESSMENT AND PLAN  Perianal abscess, systemic inflammatory response syndrome  Plan: Patient with labs and imaging as dictated above.  Patient had presented with sepsis-like symptoms. He was having rectal pain and has CT findings consistent with perianal abscess which is recurrent for him. He had a perianal abscess drained 2 years ago by Dr. Burt Knack. I will discuss with general surgery for evaluation and admission   [name], MD   Note: This note was generated in part or whole with voice recognition software. Voice recognition is usually quite accurate but there are transcription errors that can and very often do occur. I apologize for any typographical errors that were not detected and corrected.     [name], MD 01/17/17 [place], MD 01/17/17 2250

## 2017-01-18 ENCOUNTER — Encounter
Admission: EM | Disposition: A | Discharge status: Home / Self Care | Payer: Self-pay | Source: Home / Self Care | Attending: Surgery

## 2017-01-18 ENCOUNTER — Inpatient Hospital Stay: Payer: BC Managed Care – PPO | Admitting: Anesthesiology

## 2017-01-18 HISTORY — PX: INCISION AND DRAINAGE PERIRECTAL ABSCESS: SHX1804

## 2017-01-18 LAB — CBC WITH DIFFERENTIAL/PLATELET
BASOS ABS: 0 10*3/uL (ref 0–0.1)
Basophils Relative: 0 %
Eosinophils Absolute: 0 10*3/uL (ref 0–0.7)
Eosinophils Relative: 0 %
HEMATOCRIT: 38.9 % — AB (ref 40.0–52.0)
Hemoglobin: 13.1 g/dL (ref 13.0–18.0)
LYMPHS PCT: 12 %
Lymphs Abs: 1.7 10*3/uL (ref 1.0–3.6)
MCH: 28 pg (ref 26.0–34.0)
MCHC: 33.7 g/dL (ref 32.0–36.0)
MCV: 83.2 fL (ref 80.0–100.0)
MONO ABS: 1.4 10*3/uL — AB (ref 0.2–1.0)
Monocytes Relative: 9 %
NEUTROS ABS: 11.4 10*3/uL — AB (ref 1.4–6.5)
Neutrophils Relative %: 79 %
Platelets: 281 10*3/uL (ref 150–440)
RBC: 4.67 MIL/uL (ref 4.40–5.90)
RDW: 13.8 % (ref 11.5–14.5)
WBC: 14.6 10*3/uL — AB (ref 3.8–10.6)

## 2017-01-18 SURGERY — INCISION AND DRAINAGE, ABSCESS, PERIRECTAL
Anesthesia: General | Site: Rectum | Wound class: Dirty or Infected

## 2017-01-18 MED ORDER — OXYCODONE-ACETAMINOPHEN 5-325 MG PO TABS
1.0000 | ORAL_TABLET | ORAL | Status: DC | PRN
  Administered 2017-01-18: 2 via ORAL
  Filled 2017-01-18 (×2): qty 2

## 2017-01-18 MED ORDER — PROPOFOL 10 MG/ML IV BOLUS
INTRAVENOUS | Status: DC | PRN
  Administered 2017-01-18: 180 mg via INTRAVENOUS

## 2017-01-18 MED ORDER — FENTANYL CITRATE (PF) 100 MCG/2ML IJ SOLN
INTRAMUSCULAR | Status: AC
  Administered 2017-01-18: 25 ug via INTRAVENOUS
  Filled 2017-01-18: qty 2

## 2017-01-18 MED ORDER — SUGAMMADEX SODIUM 200 MG/2ML IV SOLN
INTRAVENOUS | Status: DC | PRN
  Administered 2017-01-18: 285.8 mg via INTRAVENOUS

## 2017-01-18 MED ORDER — VANCOMYCIN HCL 10 G IV SOLR
1250.0000 mg | Freq: Three times a day (TID) | INTRAVENOUS | Status: DC
  Administered 2017-01-18 (×3): 1250 mg via INTRAVENOUS
  Filled 2017-01-18 (×5): qty 1250

## 2017-01-18 MED ORDER — SUCCINYLCHOLINE CHLORIDE 20 MG/ML IJ SOLN
INTRAMUSCULAR | Status: DC | PRN
  Administered 2017-01-18: 160 mg via INTRAVENOUS

## 2017-01-18 MED ORDER — INFLUENZA VAC SPLIT QUAD 0.5 ML IM SUSY: 0.5000 mL | PREFILLED_SYRINGE | INTRAMUSCULAR | Status: DC

## 2017-01-18 MED ORDER — ROCURONIUM BROMIDE 100 MG/10ML IV SOLN
INTRAVENOUS | Status: DC | PRN
  Administered 2017-01-18: 15 mg via INTRAVENOUS
  Administered 2017-01-18: 5 mg via INTRAVENOUS
  Administered 2017-01-18: 20 mg via INTRAVENOUS

## 2017-01-18 MED ORDER — SUCCINYLCHOLINE CHLORIDE 200 MG/10ML IV SOSY
PREFILLED_SYRINGE | INTRAVENOUS | Status: AC
  Filled 2017-01-18: qty 10

## 2017-01-18 MED ORDER — FENTANYL CITRATE (PF) 100 MCG/2ML IJ SOLN
INTRAMUSCULAR | Status: AC
  Filled 2017-01-18: qty 2

## 2017-01-18 MED ORDER — PROPOFOL 10 MG/ML IV BOLUS
INTRAVENOUS | Status: AC
  Filled 2017-01-18: qty 20

## 2017-01-18 MED ORDER — SEVOFLURANE IN SOLN
RESPIRATORY_TRACT | Status: AC
  Filled 2017-01-18: qty 250

## 2017-01-18 MED ORDER — ACETAMINOPHEN 325 MG PO TABS: 650.0000 mg | ORAL_TABLET | ORAL | Status: DC | PRN

## 2017-01-18 MED ORDER — MIDAZOLAM HCL 2 MG/2ML IJ SOLN
INTRAMUSCULAR | Status: AC
  Filled 2017-01-18: qty 2

## 2017-01-18 MED ORDER — ACETAMINOPHEN 10 MG/ML IV SOLN: 1000.0000 mg | Freq: Once | INTRAVENOUS | Status: AC

## 2017-01-18 MED ORDER — KETOROLAC TROMETHAMINE 30 MG/ML IJ SOLN
30.0000 mg | Freq: Once | INTRAMUSCULAR | Status: AC
  Administered 2017-01-18: 30 mg via INTRAVENOUS

## 2017-01-18 MED ORDER — MORPHINE SULFATE (PF) 2 MG/ML IV SOLN
INTRAVENOUS | Status: AC
  Administered 2017-01-18: 2 mg via INTRAVENOUS
  Filled 2017-01-18: qty 1

## 2017-01-18 MED ORDER — ROCURONIUM BROMIDE 50 MG/5ML IV SOLN
INTRAVENOUS | Status: AC
  Filled 2017-01-18: qty 1

## 2017-01-18 MED ORDER — FENTANYL CITRATE (PF) 100 MCG/2ML IJ SOLN
25.0000 ug | INTRAMUSCULAR | Status: DC | PRN
  Administered 2017-01-18 (×4): 25 ug via INTRAVENOUS

## 2017-01-18 MED ORDER — LACTATED RINGERS IV SOLN
INTRAVENOUS | Status: DC
  Administered 2017-01-18 – 2017-01-19 (×2): via INTRAVENOUS

## 2017-01-18 MED ORDER — KETOROLAC TROMETHAMINE 30 MG/ML IJ SOLN
INTRAMUSCULAR | Status: AC
  Administered 2017-01-18: 30 mg via INTRAVENOUS
  Filled 2017-01-18: qty 1

## 2017-01-18 MED ORDER — PROMETHAZINE HCL 25 MG/ML IJ SOLN
12.5000 mg | Freq: Four times a day (QID) | INTRAMUSCULAR | Status: DC | PRN
  Administered 2017-01-19 (×2): 12.5 mg via INTRAVENOUS
  Filled 2017-01-18 (×2): qty 1

## 2017-01-18 MED ORDER — MIDAZOLAM HCL 2 MG/2ML IJ SOLN
INTRAMUSCULAR | Status: DC | PRN
  Administered 2017-01-18: 2 mg via INTRAVENOUS

## 2017-01-18 MED ORDER — LIDOCAINE HCL (CARDIAC) 20 MG/ML IV SOLN
INTRAVENOUS | Status: DC | PRN
  Administered 2017-01-18: 50 mg via INTRAVENOUS

## 2017-01-18 MED ORDER — MORPHINE SULFATE (PF) 4 MG/ML IV SOLN
2.0000 mg | INTRAVENOUS | Status: DC | PRN
  Administered 2017-01-18 – 2017-01-19 (×4): 2 mg via INTRAVENOUS
  Filled 2017-01-18 (×5): qty 1

## 2017-01-18 MED ORDER — LACTATED RINGERS IV SOLN
INTRAVENOUS | Status: DC | PRN
  Administered 2017-01-18: via INTRAVENOUS

## 2017-01-18 MED ORDER — FENTANYL CITRATE (PF) 100 MCG/2ML IJ SOLN
INTRAMUSCULAR | Status: DC | PRN
  Administered 2017-01-18: 25 ug via INTRAVENOUS
  Administered 2017-01-18: 100 ug via INTRAVENOUS

## 2017-01-18 MED ORDER — LIDOCAINE HCL (PF) 2 % IJ SOLN
INTRAMUSCULAR | Status: AC
  Filled 2017-01-18: qty 2

## 2017-01-18 SURGICAL SUPPLY — 31 items
BLADE SURG 11 STRL SS SAFETY (MISCELLANEOUS) ×2 IMPLANT
BRIEF STRETCH MATERNITY 2XLG (MISCELLANEOUS) IMPLANT
CANISTER SUCT 1200ML W/VALVE (MISCELLANEOUS) ×2 IMPLANT
DRAIN PENROSE 5/8X18 LTX STRL (WOUND CARE) ×2 IMPLANT
DRAPE LAPAROTOMY 100X77 ABD (DRAPES) ×2 IMPLANT
DRAPE LEGGINS SURG 28X43 STRL (DRAPES) ×2 IMPLANT
DRAPE UNDER BUTTOCK W/FLU (DRAPES) IMPLANT
ELECT REM PT RETURN 9FT ADLT (ELECTROSURGICAL) ×2
ELECTRODE REM PT RTRN 9FT ADLT (ELECTROSURGICAL) ×1 IMPLANT
GLOVE BIO SURGEON STRL SZ7.5 (GLOVE) ×2 IMPLANT
GLOVE INDICATOR 8.0 STRL GRN (GLOVE) ×2 IMPLANT
GOWN STRL REUS W/ TWL LRG LVL3 (GOWN DISPOSABLE) ×2 IMPLANT
GOWN STRL REUS W/TWL LRG LVL3 (GOWN DISPOSABLE) ×2
KIT RM TURNOVER STRD PROC AR (KITS) ×2 IMPLANT
LABEL OR SOLS (LABEL) IMPLANT
NEEDLE HYPO 25X1 1.5 SAFETY (NEEDLE) IMPLANT
NS IRRIG 500ML POUR BTL (IV SOLUTION) ×2 IMPLANT
PACK BASIN MINOR ARMC (MISCELLANEOUS) ×2 IMPLANT
PAD OB MATERNITY 4.3X12.25 (PERSONAL CARE ITEMS) IMPLANT
PAD PREP 24X41 OB/GYN DISP (PERSONAL CARE ITEMS) ×2 IMPLANT
SOL PREP PVP 2OZ (MISCELLANEOUS) ×2
SOLUTION PREP PVP 2OZ (MISCELLANEOUS) ×1 IMPLANT
SURGILUBE 2OZ TUBE FLIPTOP (MISCELLANEOUS) ×2 IMPLANT
SUT ETHILON 3-0 FS-10 30 BLK (SUTURE) ×4
SUT SILK 0 SH 30 (SUTURE) IMPLANT
SUT VIC AB 3-0 SH 27 (SUTURE)
SUT VIC AB 3-0 SH 27X BRD (SUTURE) IMPLANT
SUTURE EHLN 3-0 FS-10 30 BLK (SUTURE) ×2 IMPLANT
SWAB CULTURE AMIES ANAERIB BLU (MISCELLANEOUS) ×2 IMPLANT
SYR BULB IRRIG 60ML STRL (SYRINGE) ×2 IMPLANT
SYR CONTROL 10ML (SYRINGE) IMPLANT

## 2017-01-18 NOTE — Anesthesia Preprocedure Evaluation (Addendum)
Anesthesia Evaluation  Patient identified by MRN, date of birth, ID band Patient awake    Reviewed: Allergy & Precautions, H&P , NPO status , Patient's Chart, lab work & pertinent test results, reviewed documented beta blocker date and time   History of Anesthesia Complications (+) PROLONGED EMERGENCE, Emergence Delirium and history of anesthetic complications  Airway Mallampati: II  TM Distance: >3 FB Neck ROM: full    Dental  (+) Teeth Intact   Pulmonary neg pulmonary ROS,    Pulmonary exam normal        Cardiovascular hypertension, negative cardio ROS Normal cardiovascular exam Rhythm:regular Rate:Normal     Neuro/Psych negative neurological ROS  negative psych ROS   GI/Hepatic negative GI ROS, Neg liver ROS,   Endo/Other  negative endocrine ROS  Renal/GU negative Renal ROS  negative genitourinary   Musculoskeletal   Abdominal   Peds  Hematology negative hematology ROS (+)   Anesthesia Other Findings Past Medical History: No date: Colitis No date: Complication of anesthesia     Comment: Prolonged Emergence No date: Tonsillitis Past Surgical History: 01/15/2017: COLONOSCOPY WITH PROPOFOL N/A     Comment: Procedure: COLONOSCOPY WITH PROPOFOL;                Surgeon: Lucilla Lame, MD;  Location: [place];  Service: Endoscopy;  Laterality:              N/A; 04/19/2015: INCISION AND DRAINAGE PERIRECTAL ABSCESS N/A     Comment: Procedure: IRRIGATION AND DEBRIDEMENT               PERIRECTAL ABSCESS;  Surgeon: Florene Glen,              MD;  Location: [facility];  Service: General;                Laterality: N/A; 12-2014: INCISION AND DRAINAGE PERIRECTAL ABSCESS 01/15/2017: POLYPECTOMY     Comment: Procedure: POLYPECTOMY;  Surgeon: Lucilla Lame,              MD;  Location: [place];  Service:               Endoscopy;; 10/04/2015: TONSILLECTOMY N/A     Comment: Procedure:   control of TONSILLECTOMY hemorrage              ;  Surgeon: Carloyn Manner, MD;  Location:               [facility];  Service: ENT;  Laterality: N/A; 10/02/2015: TONSILLECTOMY AND ADENOIDECTOMY N/A     Comment: Procedure: TONSILLECTOMY AND ADENOIDECTOMY;                Surgeon: Beverly Gust, MD;  Location: [facility];  Service: ENT;  Laterality: N/A; 10/04/2015: TONSILLECTOMY AND ADENOIDECTOMY N/A     Comment: Procedure: control TONSILLECTOMY hemorrage;                Surgeon: Clyde Canterbury, MD;  Location: [facility];              Service: ENT;  Laterality: N/A; BMI    Body Mass Index:  41.56 kg/m     Reproductive/Obstetrics negative OB ROS  Anesthesia Physical Anesthesia Plan  ASA: II and emergent  Anesthesia Plan: General ETT   Post-op Pain Management:    Induction: Rapid sequence, Cricoid pressure planned and Intravenous  Airway Management Planned: Oral ETT  Additional Equipment:   Intra-op Plan:   Post-operative Plan:   Informed Consent: I have reviewed the patients History and Physical, chart, labs and discussed the procedure including the risks, benefits and alternatives for the proposed anesthesia with the patient or authorized representative who has indicated his/her understanding and acceptance.   Dental Advisory Given  Plan Discussed with: CRNA  Anesthesia Plan Comments: (Pt last drank contrast at 2030.  Surgeon is aware but has declared this to be an emergency status and feels that the patient cannot safely wait.  We will proceed with RSI GOT.  Risks and benefits discussed with the patient.  JA)       Anesthesia Quick Evaluation

## 2017-01-18 NOTE — Progress Notes (Signed)
01/18/2017  Subjective: Patient is Day of Surgery status post incision and drainage of a right anterior anorectal abscess with Dr. Bary Castilla . No acute events overnight. Patient reports that he still having soreness but different kind of pain over the the perianal area. No fevers, no nausea, no vomiting.  Vital signs: Temp:  [97.3 F (36.3 C)-103.2 F (39.6 C)] 98.6 F (37 C) (02/11 0819) Pulse Rate:  [79-160] 85 (02/11 0819) Resp:  [12-29] 18 (02/11 0819) BP: (108-130)/(55-78) 113/73 (02/11 0819) SpO2:  [94 %-100 %] 100 % (02/11 0819) Weight:  [142.9 kg (315 lb)-148.6 kg (327 lb 8 oz)] 148.6 kg (327 lb 8 oz) (02/11 [id number])   Intake/Output: 02/10 0701 - 02/11 0700 In: 1366.7 [I.V.:1116.7; IV Piggyback:250] Out: 615 [Urine:600; Blood:15] Last BM Date: 01/17/17  Physical Exam: Constitutional: No acute distress Rectal: Dressing in place with mild serosanguineous drainage and Penrose drain in place.   Labs:   Recent Labs  01/17/17 1905 01/18/17 0751  WBC 21.2* 14.6*  HGB 15.5 13.1  HCT 46.9 38.9*  PLT 432 281    Recent Labs  01/17/17 1905  NA 138  K 3.9  CL 106  CO2 21*  GLUCOSE 123*  BUN 16  CREATININE 1.14  CALCIUM 9.2   No results for input(s): LABPROT, INR in the last 72 hours.  Imaging: Ct Abdomen Pelvis W Contrast  Result Date: 01/17/2017 CLINICAL DATA:  21 y/o M; 4 days of rectal pain with recent colonoscopy and history of rectal abscess. EXAM: CT ABDOMEN AND PELVIS WITH CONTRAST TECHNIQUE: Multidetector CT imaging of the abdomen and pelvis was performed using the standard protocol following bolus administration of intravenous contrast. CONTRAST:  151mL ISOVUE-300 IOPAMIDOL (ISOVUE-300) INJECTION 61% COMPARISON:  01/06/2017 CT abdomen and pelvis. FINDINGS: Lower chest: No acute abnormality. Hepatobiliary: No focal liver abnormality is seen. No gallstones, gallbladder wall thickening, or biliary dilatation. Pancreas: Unremarkable. No pancreatic ductal dilatation or  surrounding inflammatory changes. Spleen: Normal in size without focal abnormality. Adrenals/Urinary Tract: Adrenal glands are unremarkable. Kidneys are normal, without renal calculi, focal lesion, or hydronephrosis. Bladder is unremarkable. Stomach/Bowel: Stomach is within normal limits. Appendix appears normal. No evidence of bowel wall thickening, distention, or inflammatory changes. Vascular/Lymphatic: No significant vascular findings are present. No enlarged abdominal or pelvic lymph nodes. Reproductive: Prostate is unremarkable. Other: Perianal abscess to the right and anterior of the anus extending from approximately 9:00 to 1:00. The abscess measures 43 x 33 x 30 mm (AP x ML x CC series 2, image 110 and series 5 image 93). Inflammatory changes throughout the right gluteal fold. The Musculoskeletal: No acute or significant osseous findings. IMPRESSION: Recurrent perianal abscess right and anterior to the anus measuring up to 43 mm with surrounding inflammatory changes of the right gluteal fold. Electronically Signed   By: Kristine Garbe M.D.   On: 01/17/2017 22:36   Dg Chest Port 1 View  Result Date: 01/17/2017 CLINICAL DATA:  Fever congestion EXAM: PORTABLE CHEST 1 VIEW COMPARISON:  None. FINDINGS: The heart size and mediastinal contours are within normal limits. Both lungs are clear. The visualized skeletal structures are unremarkable. IMPRESSION: No active disease. Electronically Signed   By: Donavan Foil M.D.   On: 01/17/2017 19:43    Assessment/Plan: 21 year old male status post incision and drainage of perirectal abscess.  -Patient's white blood cell count has improved today but we'll continue IV antibiotics until white blood cell count is normalized. -Advance diet as tolerated today. -Start oral pain medications. -Likely discharge to home  over the next 1 or 2 days.   Melvyn Neth, [place]

## 2017-01-18 NOTE — Anesthesia Procedure Notes (Signed)
Procedure Name: Intubation Date/Time: 01/18/2017 12:15 PM Performed by: Allean Found Pre-anesthesia Checklist: Patient identified, Emergency Drugs available, Suction available, Patient being monitored and Timeout performed Patient Re-evaluated:Patient Re-evaluated prior to inductionOxygen Delivery Method: Circle system utilized Preoxygenation: Pre-oxygenation with 100% oxygen Intubation Type: IV induction Ventilation: Mask ventilation without difficulty Laryngoscope Size: Mac and 4 Grade View: Grade I Tube type: Oral Tube size: 7.5 mm Number of attempts: 1 Airway Equipment and Method: Stylet Placement Confirmation: ETT inserted through vocal cords under direct vision,  positive ETCO2 and breath sounds checked- equal and bilateral Secured at: 24 cm Dental Injury: Teeth and Oropharynx as per pre-operative assessment

## 2017-01-18 NOTE — Anesthesia Post-op Follow-up Note (Cosign Needed)
Anesthesia QCDR form completed.        

## 2017-01-18 NOTE — Transfer of Care (Signed)
Immediate Anesthesia Transfer of Care Note  Patient: Ronnie Smith.  Procedure(s) Performed: Procedure(s): IRRIGATION AND DEBRIDEMENT PERIRECTAL ABSCESS (N/A)  Patient Location: PACU  Anesthesia Type:General  Level of Consciousness: sedated  Airway & Oxygen Therapy: Patient Spontanous Breathing and Patient connected to face mask oxygen  Post-op Assessment: Report given to RN and Post -op Vital signs reviewed and stable  Post vital signs: Reviewed and stable  Last Vitals:  Vitals:   01/17/17 2230 01/17/17 2300  BP: 114/62 (!) 108/55  Pulse: (!) 112 (!) 118  Resp: (!) 26 (!) 27  Temp:      Last Pain:  Vitals:   01/17/17 2113  TempSrc: Oral  PainSc:          Complications: No apparent anesthesia complications

## 2017-01-18 NOTE — Op Note (Signed)
Preoperative diagnosis: History of rectal bleeding, perirectal abscess.  Postoperative diagnosis: Same, anal fissure, perirectal abscess  Operative procedure: Exam under anesthesia, incision and drainage right anterior perirectal abscess.  Operating surgeon: Hervey Ard, M.D.  Anesthesia: Gen. endotracheal.  Estimated blood loss: Less than 20 mL.  Clinical note: This 21 year old male who participates in anal receptive sex is had multiple perirectal abscesses requiring operative drainage. He presented with a 2 day history of fever or chills and perirectal pain. CT scan showed evidence of a right anterior abscess extending across the 12:00 position. Recent colonoscopy showed some plaque-like areas in the lower rectum from which biopsy is pending.  Operative note: With the patient under adequate general endotracheal anesthesia was placed in dorsal lithotomy position and the perineum prepped with Betadine solution draped. Exam under anesthesia showed a normal prostate and some nodular areas a lower rectal mucosa which may represent early changes of squamous metaplasia. These were not rebiopsied based on his pending biopsies. There is evidence of a posterior anal fissure. Majority of bleeding was from this site. Palpation showed a thickened area at the 11:00 position and a radial incision was made approximate 3 cm in anal verge and carried down through skin and subcutis tissue with release of a proximal 60 mL of peripheral material. Aerobic, anaerobic and Gram stain obtained. Finger dissection was used from the 9:00 position and a 1/2 inch Penrose drain was placed with a transfixion suture of 3-0 nylon. This was completed after irrigation of the cavity with saline. The patient was placed back supine and taken to the recovery room in stable condition.

## 2017-01-19 ENCOUNTER — Encounter: Payer: Self-pay | Admitting: Gastroenterology

## 2017-01-19 ENCOUNTER — Encounter: Payer: Self-pay | Admitting: General Surgery

## 2017-01-19 LAB — CHLAMYDIA/NGC RT PCR (ARMC ONLY)
CHLAMYDIA TR: NOT DETECTED
N gonorrhoeae: NOT DETECTED

## 2017-01-19 LAB — VANCOMYCIN, TROUGH: Vancomycin Tr: 12 ug/mL — ABNORMAL LOW (ref 15–20)

## 2017-01-19 LAB — CBC WITH DIFFERENTIAL/PLATELET
Basophils Absolute: 0.1 10*3/uL (ref 0–0.1)
Basophils Relative: 1 %
EOS ABS: 0.1 10*3/uL (ref 0–0.7)
Eosinophils Relative: 1 %
HCT: 39 % — ABNORMAL LOW (ref 40.0–52.0)
HEMOGLOBIN: 13 g/dL (ref 13.0–18.0)
LYMPHS ABS: 2.4 10*3/uL (ref 1.0–3.6)
LYMPHS PCT: 25 %
MCH: 28 pg (ref 26.0–34.0)
MCHC: 33.4 g/dL (ref 32.0–36.0)
MCV: 84 fL (ref 80.0–100.0)
Monocytes Absolute: 0.7 10*3/uL (ref 0.2–1.0)
Monocytes Relative: 8 %
NEUTROS ABS: 6.2 10*3/uL (ref 1.4–6.5)
NEUTROS PCT: 65 %
Platelets: 281 10*3/uL (ref 150–440)
RBC: 4.65 MIL/uL (ref 4.40–5.90)
RDW: 13.8 % (ref 11.5–14.5)
WBC: 9.4 10*3/uL (ref 3.8–10.6)

## 2017-01-19 MED ORDER — GABAPENTIN 600 MG PO TABS
300.0000 mg | ORAL_TABLET | Freq: Three times a day (TID) | ORAL | Status: DC
  Administered 2017-01-19 – 2017-01-20 (×4): 300 mg via ORAL
  Filled 2017-01-19 (×3): qty 1
  Filled 2017-01-19: qty 2

## 2017-01-19 MED ORDER — VANCOMYCIN HCL 10 G IV SOLR
1500.0000 mg | Freq: Three times a day (TID) | INTRAVENOUS | Status: AC
  Administered 2017-01-19 – 2017-01-20 (×4): 1500 mg via INTRAVENOUS
  Filled 2017-01-19 (×5): qty 1500

## 2017-01-19 MED ORDER — KETOROLAC TROMETHAMINE 30 MG/ML IJ SOLN
30.0000 mg | Freq: Four times a day (QID) | INTRAMUSCULAR | Status: DC
  Administered 2017-01-19 – 2017-01-20 (×4): 30 mg via INTRAVENOUS
  Filled 2017-01-19 (×4): qty 1

## 2017-01-19 NOTE — Progress Notes (Signed)
Pharmacy Antibiotic Note  Ronnie Smith. is a 21 y.o. male admitted on 01/17/2017 with sepsis.  Pharmacy has been consulted for vancomycin and Zosyn dosing.  Plan: DW 105kg  Vd 73L kei 0.1 hr-1  T1/2 7 hours Vancomycin 1250 mg q 8 hours ordered. Not candidate for stacked dosing. Level before 5th overall dose. Goal trough 15-20.  Zosyn 4.5 grams q 8 hours ordered for TBW >120 kg.  Height: 6\' 1"  (185.4 cm) Weight: (!) 327 lb 8 oz (148.6 kg) IBW/kg (Calculated) : 79.9  Temp (24hrs), Avg:98.9 F (37.2 C), Min:98.6 F (37 C), Max:99.1 F (37.3 C)   Recent Labs Lab 01/17/17 1904 01/17/17 1905 01/17/17 2107 01/18/17 0751 01/19/17 0344  WBC  --  21.2*  --  14.6* 9.4  CREATININE  --  1.14  --   --   --   LATICACIDVEN 2.1*  --  1.5  --   --   VANCOTROUGH  --   --   --   --  12*    Estimated Creatinine Clearance: 155.7 mL/min (by C-G formula based on SCr of 1.14 mg/dL).    Allergies  Allergen Reactions  . Doxycycline Rash  . Zofran [Ondansetron Hcl] Nausea And Vomiting and Rash    Antimicrobials this admission: Zosyn, vancomycin 2/10 >>    >>   Dose adjustments this admission: 2/12 03:30 vanc level 12. Changing to 1500 mg q 8 hours. Level before 4th new dose.   Microbiology results: 2/10 BCx: pending    2/10 UA: (-) 2/10 CXR: no active disease  Thank you for allowing pharmacy to be a part of this patient's care.  Baeleigh Devincent S 01/19/2017 4:41 AM

## 2017-01-19 NOTE — Progress Notes (Signed)
Pt refusing Sitz Baths.

## 2017-01-19 NOTE — Progress Notes (Signed)
POD # 2 Feeling a little better but still has significant pain AVSS  PE NAD Rectal: penrose in place w some purulent drainage. No evidence of undrained collection or perineal sepsis  A/P Doing better Still needs IV narcotics, will add toradol and gabapentin Continue IV A/Bs Possible DC in am

## 2017-01-20 LAB — VANCOMYCIN, TROUGH: VANCOMYCIN TR: 21 ug/mL — AB (ref 15–20)

## 2017-01-20 LAB — CREATININE, SERUM
Creatinine, Ser: 1 mg/dL (ref 0.61–1.24)
GFR calc Af Amer: >60 mL/min (ref >60)
GFR calc non Af Amer: >60 mL/min (ref >60)

## 2017-01-20 LAB — HIV ANTIBODY (ROUTINE TESTING W REFLEX): HIV SCREEN 4TH GENERATION: NONREACTIVE

## 2017-01-20 MED ORDER — OXYCODONE-ACETAMINOPHEN 7.5-325 MG PO TABS: 1.0000 | ORAL_TABLET | ORAL | 0 refills | Status: DC | PRN

## 2017-01-20 MED ORDER — CIPROFLOXACIN HCL 500 MG PO TABS: 500.0000 mg | ORAL_TABLET | Freq: Two times a day (BID) | ORAL | 0 refills | Status: DC

## 2017-01-20 MED ORDER — METRONIDAZOLE 500 MG PO TABS: 500.0000 mg | ORAL_TABLET | Freq: Three times a day (TID) | ORAL | 0 refills | Status: DC

## 2017-01-20 MED ORDER — VANCOMYCIN HCL 10 G IV SOLR
1250.0000 mg | Freq: Three times a day (TID) | INTRAVENOUS | Status: DC
  Filled 2017-01-20 (×2): qty 1250

## 2017-01-20 NOTE — Progress Notes (Signed)
   01/20/17 0700  Clinical Encounter Type  Visited With Patient  Visit Type Initial;Psychological support;Spiritual support;Social support  Referral From Nurse  Spiritual Encounters  Spiritual Needs Emotional;Prayer  Stress Factors  Patient Stress Factors Family relationships  Family Stress Factors Major life changes  [place] visited with patient per consult; Patient relationship with mother strained over patient sexual orientation and conflict with mother's faith.  Patient seems to have family support from father and sister and from community in [place] and [place]; 7:48 AM Gwynn Burly

## 2017-01-20 NOTE — Clinical Social Work Note (Signed)
Clinical Social Work Assessment  Patient Details  Name: Ronnie Smith. MRN: [id number] Date of Birth: 1996/11/15  Date of referral:  01/20/17               Reason for consult:  [employer] sought to share information with:  [place] granted to share information::  Yes, Verbal Permission Granted  Name::        Agency::   [facility]   Relationship::     Contact Information:     Housing/Transportation Living arrangements for the past 2 months:  [employer] of Information:  Patient Patient Interpreter Needed:  None Criminal Activity/Legal Involvement Pertinent to Current Situation/Hospitalization:  No - Comment as needed Significant Relationships:  Parents Lives with:  Parents Do you feel safe going back to the place where you live?  Yes Need for family participation in patient care:  Yes (Comment)  Care giving concerns:  Patient lives at home with his mother in [place].   Social Worker assessment / plan:  Holiday representative ([place]) received a consult to provide LGBTQ community resources. CSW and social work Theatre manager met with patient at bedside. Patient was laying in bed and just woke up. Per patient, he lives in [place] with his mother. Patient confirmed that his mother is not supportive of patient being gay. Patient explained that he has a lot of resources and supports in the [place] and [place] area, but wanted to  receive information about supports and connections closer to his home in [place]. CSW provided the resources of [employer]. Patient was very thankful for the information given. Patient report he has no other needs at this time. Please re-consult of future social work needs arise. CSW signing off.    Employment status:    Nurse, adult PT Recommendations:  Not assessed at this time Information / Referral to community resources:     Patient/Family's Response to care:  Patient seemed  interested in connecting with [employer].   Patient/Family's Understanding of and Emotional Response to Diagnosis, Current Treatment, and Prognosis:  Patient was pleasant and thanked social work Theatre manager and CSW for visiting.   Emotional Assessment Appearance:  Appears stated age Attitude/Demeanor/Rapport:    Affect (typically observed):  Accepting, Adaptable, Appropriate Orientation:  Oriented to Self, Oriented to Place, Oriented to  Time, Oriented to Situation Alcohol / Substance use:  Not Applicable Psych involvement (Current and /or in the community):  No (Comment)  Discharge Needs  Concerns to be addressed:  Basic Needs Readmission within the last 30 days:  No Current discharge risk:  Lack of support system Barriers to Discharge:  Continued Medical Work up   [employer], [place] Work 01/20/2017, 11:20 AM

## 2017-01-20 NOTE — Discharge Summary (Signed)
Patient ID: Ronnie Smith. MRN: [id number] DOB/AGE: 1995/12/29 20 y.o.  Admit date: 01/17/2017 Discharge date: 01/20/2017   Discharge Diagnoses:  Active Problems:   Perirectal abscess   Procedures: I/D of perirectal abscess  Hospital Course: 21 yo male admitted w sepsis from perirectal abscess. Underwent uneventful I/D w penrose placement. He had significant improvement of his physiologic parameters and symptoms. STDs and HIV was Negative. Cultures showed Gram Neg rods. At the time of DC he was doing very well his VSS and he was afebrile. His PE showed a male in NAD, awake . Abd: soft, NT. Rectal : penrose in place w scant drainage, no evidence of un drained collection or necrosis.  Ext: no edema and well perfused. He will be DC w Drain in place.  Disposition: 01-Home or Self Care  Discharge Instructions    Call MD for:  difficulty breathing, headache or visual disturbances    Complete by:  As directed    Call MD for:  extreme fatigue    Complete by:  As directed    Call MD for:  hives    Complete by:  As directed    Call MD for:  persistant dizziness or light-headedness    Complete by:  As directed    Call MD for:  persistant nausea and vomiting    Complete by:  As directed    Call MD for:  redness, tenderness, or signs of infection (pain, swelling, redness, odor or green/yellow discharge around incision site)    Complete by:  As directed    Call MD for:  severe uncontrolled pain    Complete by:  As directed    Call MD for:  temperature >100.4    Complete by:  As directed    Diet - low sodium heart healthy    Complete by:  As directed    Discharge instructions    Complete by:  As directed    BID sitz baths may use pad and change it BID.   Increase activity slowly    Complete by:  As directed      Allergies as of 01/20/2017      Reactions   Doxycycline Rash   Zofran [ondansetron Hcl] Nausea And Vomiting, Rash      Medication List    STOP taking these  medications   HYDROcodone-acetaminophen 5-325 MG tablet Commonly known as:  NORCO/VICODIN   oxyCODONE-acetaminophen 10-325 MG tablet Commonly known as:  PERCOCET Replaced by:  oxyCODONE-acetaminophen 7.5-325 MG tablet     TAKE these medications   ciprofloxacin 500 MG tablet Commonly known as:  CIPRO Take 1 tablet (500 mg total) by mouth 2 (two) times daily.   dicyclomine 20 MG tablet Commonly known as:  BENTYL Take 1 tablet (20 mg total) by mouth every 6 (six) hours as needed.   metoCLOPramide 10 MG tablet Commonly known as:  REGLAN Take 1 tablet (10 mg total) by mouth 3 (three) times daily with meals.   metroNIDAZOLE 500 MG tablet Commonly known as:  FLAGYL Take 1 tablet (500 mg total) by mouth 3 (three) times daily.   naproxen 500 MG tablet Commonly known as:  NAPROSYN Take 1 tablet (500 mg total) by mouth 2 (two) times daily with a meal.   ondansetron 8 MG disintegrating tablet Commonly known as:  ZOFRAN ODT Take 1 tablet (8 mg total) by mouth every 8 (eight) hours as needed for nausea or vomiting.   ondansetron 4 MG disintegrating tablet Commonly known as:  ZOFRAN ODT Take 1 tablet (4 mg total) by mouth every 8 (eight) hours as needed for nausea or vomiting.   oxyCODONE 5 MG/5ML solution Commonly known as:  ROXICODONE Take 5 mLs (5 mg total) by mouth every 4 (four) hours as needed for moderate pain.   oxyCODONE-acetaminophen 7.5-325 MG tablet Commonly known as:  PERCOCET Take 1 tablet by mouth every 4 (four) hours as needed for severe pain. Replaces:  oxyCODONE-acetaminophen 10-325 MG tablet   polyethylene glycol 236 g solution Commonly known as:  GOLYTELY Drink one 8 oz glass every 20 mins until stools are clear      Follow-up Information    Brenton Joines F Brenson Hartman, MD Follow up in 1 week(s).   Specialty:  General Surgery Contact information: [address] [place] [id number] [phone]            Caroleen Hamman, MD FACS

## 2017-01-20 NOTE — Progress Notes (Signed)
Pharmacy Antibiotic Note  Ronnie Smith. is a 21 y.o. male admitted on 01/17/2017 with sepsis.  Pharmacy has been consulted for vancomycin and Zosyn dosing.  Plan: DW 105kg  Vd 73L kei 0.1 hr-1  T1/2 7 hours Vancomycin 1250 mg q 8 hours ordered. Not candidate for stacked dosing. Level before 5th overall dose. Goal trough 15-20.  Zosyn 4.5 grams q 8 hours ordered for TBW >120 kg.  2/13 0505 vancomycin level 21 mcg/mL. Recalculated Ke 0.103 hr-1. Reduce to vancomycin 1.25 gm IV Q8H. Pharmacy will continue to follow and adjust as needed to maintain trough 15 to 20 mcg/mL.   Height: 6\' 1"  (185.4 cm) Weight: (!) 327 lb 8 oz (148.6 kg) IBW/kg (Calculated) : 79.9  Temp (24hrs), Avg:97.9 F (36.6 C), Min:97.7 F (36.5 C), Max:98.1 F (36.7 C)   Recent Labs Lab 01/17/17 1904 01/17/17 1905 01/17/17 2107 01/18/17 0751 01/19/17 0344 01/20/17 0505  WBC  --  21.2*  --  14.6* 9.4  --   CREATININE  --  1.14  --   --   --  1.00  LATICACIDVEN 2.1*  --  1.5  --   --   --   VANCOTROUGH  --   --   --   --  12* 21*    Estimated Creatinine Clearance: 177.5 mL/min (by C-G formula based on SCr of 1 mg/dL).    Allergies  Allergen Reactions  . Doxycycline Rash  . Zofran [Ondansetron Hcl] Nausea And Vomiting and Rash    Antimicrobials this admission: Zosyn, vancomycin 2/10 >>    >>   Dose adjustments this admission: 2/12 03:30 vanc level 12. Changing to 1500 mg q 8 hours. Level before 4th new dose.   Microbiology results: 2/10 BCx: pending    2/10 UA: (-) 2/10 CXR: no active disease  Thank you for allowing pharmacy to be a part of this patient's care.  Laural Benes, Pharm.D., BCPS Clinical Pharmacist 01/20/2017 6:12 AM

## 2017-01-22 LAB — CULTURE, BLOOD (ROUTINE X 2)
CULTURE: NO GROWTH
Culture: NO GROWTH

## 2017-01-22 NOTE — Anesthesia Postprocedure Evaluation (Signed)
Anesthesia Post Note  Patient: [name].  Procedure(s) Performed: Procedure(s) (LRB): IRRIGATION AND DEBRIDEMENT PERIRECTAL ABSCESS (N/A)  Patient location during evaluation: PACU Anesthesia Type: General Level of consciousness: awake and alert Pain management: pain level controlled Vital Signs Assessment: post-procedure vital signs reviewed and stable Respiratory status: spontaneous breathing, nonlabored ventilation, respiratory function stable and patient connected to nasal cannula oxygen Cardiovascular status: blood pressure returned to baseline and stable Postop Assessment: no signs of nausea or vomiting Anesthetic complications: no     Last Vitals:  Vitals:   01/19/17 2100 01/20/17 0507  BP: 131/68 (!) 105/55  Pulse: 78 62  Resp: 18 18  Temp: 36.7 C 36.5 C    Last Pain:  Vitals:   01/20/17 0507  TempSrc: Oral  PainSc:                  Molli Barrows

## 2017-01-23 LAB — AEROBIC/ANAEROBIC CULTURE (SURGICAL/DEEP WOUND)

## 2017-01-23 LAB — AEROBIC/ANAEROBIC CULTURE W GRAM STAIN (SURGICAL/DEEP WOUND)

## 2017-01-29 ENCOUNTER — Encounter: Payer: BC Managed Care – PPO | Admitting: Surgery

## 2017-01-30 ENCOUNTER — Telehealth: Payer: Self-pay

## 2017-01-30 NOTE — Telephone Encounter (Signed)
-----   Message from Lucilla Lame, MD sent at 01/20/2017  9:45 AM EST ----- Please have the patient come in for a follow up.

## 2017-01-30 NOTE — Telephone Encounter (Signed)
Pt had a perirectal abscess that was drained on 01/18/17. Pt doing well. Will not need follow up appt.

## 2017-02-09 ENCOUNTER — Ambulatory Visit (INDEPENDENT_AMBULATORY_CARE_PROVIDER_SITE_OTHER): Payer: BC Managed Care – PPO | Admitting: Surgery

## 2017-02-09 ENCOUNTER — Encounter: Payer: Self-pay | Admitting: Surgery

## 2017-02-09 VITALS — BP 149/79 | HR 88 | Temp 98.3°F | Ht 74.0 in | Wt 330.0 lb

## 2017-02-09 DIAGNOSIS — Z09 Encounter for follow-up examination after completed treatment for conditions other than malignant neoplasm: Secondary | ICD-10-CM

## 2017-02-09 NOTE — Progress Notes (Signed)
S/p I/D perirectal abscess by Dr. Bary Castilla 2/11 Doing well Completed antibiotics Penrose fell 10 days ago  PE NAD Rectal: small anterior cavity healing well, no evidence of necrotizing infection or abscess  A/P Doing well Fiber and miralax for constipation RTC prn

## 2017-02-09 NOTE — Patient Instructions (Signed)
Please avoid constipation by taking Miralax 17G daily to help you have a bowel movement daily or every other day.  Please try to keep the area trimmed.  Make sure you clean your rectum after having a bowel movement with wipes. Hygiene is key.

## 2017-06-01 ENCOUNTER — Emergency Department: Payer: BC Managed Care – PPO

## 2017-06-01 ENCOUNTER — Encounter: Payer: Self-pay | Admitting: Emergency Medicine

## 2017-06-01 ENCOUNTER — Emergency Department
Admission: EM | Admit: 2017-06-01 | Discharge: 2017-06-01 | Disposition: A | Discharge status: Home / Self Care | Payer: BC Managed Care – PPO | Attending: Emergency Medicine | Admitting: Emergency Medicine

## 2017-06-01 DIAGNOSIS — R079 Chest pain, unspecified: Secondary | ICD-10-CM | POA: Diagnosis not present

## 2017-06-01 DIAGNOSIS — I10 Essential (primary) hypertension: Secondary | ICD-10-CM | POA: Insufficient documentation

## 2017-06-01 DIAGNOSIS — K921 Melena: Secondary | ICD-10-CM | POA: Insufficient documentation

## 2017-06-01 DIAGNOSIS — R1031 Right lower quadrant pain: Secondary | ICD-10-CM | POA: Insufficient documentation

## 2017-06-01 DIAGNOSIS — R110 Nausea: Secondary | ICD-10-CM | POA: Insufficient documentation

## 2017-06-01 LAB — BASIC METABOLIC PANEL
ANION GAP: 9 (ref 5–15)
BUN: 15 mg/dL (ref 6–20)
CO2: 22 mmol/L (ref 22–32)
Calcium: 9.4 mg/dL (ref 8.9–10.3)
Chloride: 108 mmol/L (ref 101–111)
Creatinine, Ser: 1.22 mg/dL (ref 0.61–1.24)
GFR calc Af Amer: >60 mL/min (ref >60)
Glucose, Bld: 99 mg/dL (ref 65–99)
POTASSIUM: 3.7 mmol/L (ref 3.5–5.1)
SODIUM: 139 mmol/L (ref 135–145)

## 2017-06-01 LAB — CBC
HEMATOCRIT: 46.6 % (ref 40.0–52.0)
Hemoglobin: 15.5 g/dL (ref 13.0–18.0)
MCH: 27.5 pg (ref 26.0–34.0)
MCHC: 33.3 g/dL (ref 32.0–36.0)
MCV: 82.6 fL (ref 80.0–100.0)
Platelets: 415 10*3/uL (ref 150–440)
RBC: 5.64 MIL/uL (ref 4.40–5.90)
RDW: 13.8 % (ref 11.5–14.5)
WBC: 9.3 10*3/uL (ref 3.8–10.6)

## 2017-06-01 LAB — TROPONIN I: Troponin I: <0.03 ng/mL (ref <0.03)

## 2017-06-01 MED ORDER — IOPAMIDOL (ISOVUE-300) INJECTION 61%
125.0000 mL | Freq: Once | INTRAVENOUS | Status: AC | PRN
  Administered 2017-06-01: 125 mL via INTRAVENOUS
  Filled 2017-06-01: qty 150

## 2017-06-01 MED ORDER — SODIUM CHLORIDE 0.9 % IV BOLUS (SEPSIS)
1000.0000 mL | Freq: Once | INTRAVENOUS | Status: AC
  Administered 2017-06-01: 1000 mL via INTRAVENOUS

## 2017-06-01 MED ORDER — KETOROLAC TROMETHAMINE 30 MG/ML IJ SOLN
INTRAMUSCULAR | Status: AC
  Filled 2017-06-01: qty 1

## 2017-06-01 MED ORDER — KETOROLAC TROMETHAMINE 10 MG PO TABS: 10.0000 mg | ORAL_TABLET | Freq: Four times a day (QID) | ORAL | 0 refills | Status: AC | PRN

## 2017-06-01 MED ORDER — KETOROLAC TROMETHAMINE 30 MG/ML IJ SOLN
30.0000 mg | Freq: Once | INTRAMUSCULAR | Status: AC
Start: 2017-06-01 — End: 2017-06-01
  Administered 2017-06-01: 30 mg via INTRAVENOUS

## 2017-06-01 MED ORDER — IOPAMIDOL (ISOVUE-300) INJECTION 61%
30.0000 mL | Freq: Once | INTRAVENOUS | Status: AC | PRN
  Administered 2017-06-01: 30 mL via ORAL
  Filled 2017-06-01: qty 30

## 2017-06-01 MED ORDER — MORPHINE SULFATE (PF) 4 MG/ML IV SOLN
4.0000 mg | Freq: Once | INTRAVENOUS | Status: AC
  Administered 2017-06-01: 4 mg via INTRAVENOUS
  Filled 2017-06-01: qty 1

## 2017-06-01 NOTE — ED Triage Notes (Signed)
Pt presents to ED via [facility]. Pt states he was at work earlier today and had just finished eating when he had sudden onset LLQ abdominal pain pain with 3 episodes of vomiting. Pt also c/o sudden onset substernal chest pain with no radiation, that was sharp in nature, pt states chest pain resolved prior to his arrival to hospital. Pt is alert and oriented at this time.

## 2017-06-01 NOTE — ED Notes (Signed)
Per ems pt with chest pain, ems gave one nitro and four baby aspirin prior to arrival, no iv established.  ECG NSR.

## 2017-06-01 NOTE — ED Notes (Signed)
Pt taken to CT.

## 2017-06-01 NOTE — ED Notes (Signed)
Pt states RLQ abd pain x 3 days, states central CP today. States vomiting x 3, denies blood in vomit. States bright red blood in stool. Alert, oriented.

## 2017-06-01 NOTE — ED Provider Notes (Signed)
[facility] Emergency Department Provider Note  Time seen: 5:12 PM  I have reviewed the triage vital signs and the nursing notes.   HISTORY  Chief Complaint Abdominal Pain and Chest Pain    HPI Ronnie Smith. is a 21 y.o. male with a past medical history of colitis, hypertension, presents the emergency department for right lower quadrant abdominal pain. According to the patient for the past 3 days he has been experiencing moderate dull aching right lower quadrant abdominal pain. Denies any diarrhea states occasional nausea but denies vomiting. Denies dysuria or hematuria. Patient also states this morning while at work he experienced some sharp chest pains but those resolved, he has not had any since but cannot, exactly what time it occurred. Patient states a history of "colitis" states he had a colonoscopy performed approximately 6 months ago. States intermittent blood in his stool but states this has been an ongoing issue this year.    Past Medical History:  Diagnosis Date  . Abdominal pain, generalized   . Blood in stool   . Colitis   . Complication of anesthesia    Prolonged Emergence  . Hemorrhage pharyngeal 10/04/2015  . HTN (hypertension) 10/05/2015  . Noninfectious diarrhea   . Perianal abscess 07/19/2015  . Perirectal abscess 04/17/2015  . S/P tonsillectomy 10/02/2015  . Tonsillitis     Patient Active Problem List   Diagnosis Date Noted  . Abdominal pain, generalized   . Blood in stool   . Noninfectious diarrhea   . Other specified diseases of anus and rectum (CODE)   . Post tonsillectomy secondary hemorrhage 10/05/2015  . HTN (hypertension) 10/05/2015  . Hemorrhage pharyngeal 10/04/2015  . S/P tonsillectomy 10/02/2015  . Perianal abscess 07/19/2015  . Perirectal abscess 04/17/2015    Past Surgical History:  Procedure Laterality Date  . COLONOSCOPY WITH PROPOFOL N/A 01/15/2017   Procedure: COLONOSCOPY WITH PROPOFOL;  Surgeon: Lucilla Lame, MD;  Location: [facility];  Service: Endoscopy;  Laterality: N/A;  . INCISION AND DRAINAGE PERIRECTAL ABSCESS N/A 04/19/2015   Procedure: IRRIGATION AND DEBRIDEMENT PERIRECTAL ABSCESS;  Surgeon: Florene Glen, MD;  Location: [facility];  Service: General;  Laterality: N/A;  . INCISION AND DRAINAGE PERIRECTAL ABSCESS  12-2014  . INCISION AND DRAINAGE PERIRECTAL ABSCESS N/A 01/18/2017   Procedure: IRRIGATION AND DEBRIDEMENT PERIRECTAL ABSCESS;  Surgeon: Robert Bellow, MD;  Location: [facility];  Service: General;  Laterality: N/A;  . POLYPECTOMY  01/15/2017   Procedure: POLYPECTOMY;  Surgeon: Lucilla Lame, MD;  Location: [facility];  Service: Endoscopy;;  . TONSILLECTOMY N/A 10/04/2015   Procedure:  control of TONSILLECTOMY hemorrage ;  Surgeon: Carloyn Manner, MD;  Location: [facility];  Service: ENT;  Laterality: N/A;  . TONSILLECTOMY AND ADENOIDECTOMY N/A 10/02/2015   Procedure: TONSILLECTOMY AND ADENOIDECTOMY;  Surgeon: Beverly Gust, MD;  Location: [facility];  Service: ENT;  Laterality: N/A;  . TONSILLECTOMY AND ADENOIDECTOMY N/A 10/04/2015   Procedure: control TONSILLECTOMY hemorrage;  Surgeon: Clyde Canterbury, MD;  Location: [facility];  Service: ENT;  Laterality: N/A;    Prior to Admission medications   Not on File    Allergies  Allergen Reactions  . Doxycycline Rash  . Zofran [Ondansetron Hcl] Nausea And Vomiting and Rash    Family History  Problem Relation Age of Onset  . Healthy Mother   . Diabetes Father   . Hypertension Father   . Cancer Neg Hx     Social History Social History  Substance  Use Topics  . Smoking status: Never Smoker  . Smokeless tobacco: Never Used  . Alcohol use No    Review of Systems Constitutional: Negative for fever. Cardiovascular: Negative for chest pain. Respiratory: Negative for shortness of breath. Gastrointestinal: Moderate right lower quadrant abdominal pain. Negative for vomiting or diarrhea. Genitourinary:  Negative for dysuria. Musculoskeletal: Negative for back pain. Skin: Negative for rash. Neurological: Negative for headache All other ROS negative  ____________________________________________   PHYSICAL EXAM:  VITAL SIGNS: ED Triage Vitals  Enc Vitals Group     BP 06/01/17 1608 125/79     Pulse Rate 06/01/17 1608 (!) 108     Resp 06/01/17 1608 18     Temp 06/01/17 1608 98.5 F (36.9 C)     Temp Source 06/01/17 1608 Oral     SpO2 06/01/17 1608 97 %     Weight 06/01/17 1602 (!) 337 lb (152.9 kg)     Height 06/01/17 1602 6\' 1"  (1.854 m)     Head Circumference --      Peak Flow --      Pain Score 06/01/17 1601 8     Pain Loc --      Pain Edu? --      Excl. in [place]? --     Constitutional: Alert and oriented. Well appearing and in no distress. Eyes: Normal exam ENT   Head: Normocephalic and atraumatic.   Mouth/Throat: Mucous membranes are moist. Cardiovascular: Normal rate, regular rhythm. No murmur Respiratory: Normal respiratory effort without tachypnea nor retractions. Breath sounds are clear  Gastrointestinal: Soft, moderate right lower quadrant tenderness palpation. No rebound or guarding. No distention. Musculoskeletal: Nontender with normal range of motion in all extremities Neurologic:  Normal speech and language. No gross focal neurologic deficits  Skin:  Skin is warm, dry and intact.  Psychiatric: Mood and affect are normal.   ____________________________________________    EKG  EKG reviewed and interpreted by myself shows sinus tachycardia 107 bpm, narrow QRS, normal axis, normal intervals, no ST changes.  ____________________________________________    RADIOLOGY  Chest x-ray negative CT negative. ____________________________________________   INITIAL IMPRESSION / ASSESSMENT AND PLAN / ED COURSE  Pertinent labs & imaging results that were available during my care of the patient were reviewed by me and considered in my medical decision making  (see chart for details).  Patient presents to the emergency department for right lower quadrant abdominal pain for the past 3 days along with chest pain this morning. Patient's labs are largely within normal limits including negative troponin. Normal white blood cell count. Normal hemoglobin and hematocrit. Patient doesn't moderate right lower quadrant tenderness to palpation. We will obtain a CT scan of the abdomen/pelvis to further evaluate. Overall the patient appears well, no distress.  CT scan is negative. We'll discharge with Toradol to be used as needed and PCP follow-up with Dr. Abran Richard off. Patient agreeable to plan.  ____________________________________________   FINAL CLINICAL IMPRESSION(S) / ED DIAGNOSES  Right lower quadrant abdominal pain Chest pain    Harvest Dark, MD 06/01/17 [phone]

## 2017-06-01 NOTE — ED Notes (Signed)
Pt taken to xray via wheelchair

## 2017-11-25 ENCOUNTER — Emergency Department
Admission: EM | Admit: 2017-11-25 | Discharge: 2017-11-25 | Disposition: A | Discharge status: Home / Self Care | Payer: BC Managed Care – PPO | Attending: Emergency Medicine | Admitting: Emergency Medicine

## 2017-11-25 ENCOUNTER — Encounter: Payer: Self-pay | Admitting: Emergency Medicine

## 2017-11-25 DIAGNOSIS — R1084 Generalized abdominal pain: Secondary | ICD-10-CM | POA: Diagnosis present

## 2017-11-25 DIAGNOSIS — I10 Essential (primary) hypertension: Secondary | ICD-10-CM | POA: Insufficient documentation

## 2017-11-25 DIAGNOSIS — K625 Hemorrhage of anus and rectum: Secondary | ICD-10-CM | POA: Diagnosis not present

## 2017-11-25 LAB — COMPREHENSIVE METABOLIC PANEL
ALK PHOS: 77 U/L (ref 38–126)
ALT: 48 U/L (ref 17–63)
AST: 29 U/L (ref 15–41)
Albumin: 4.3 g/dL (ref 3.5–5.0)
Anion gap: 3 — ABNORMAL LOW (ref 5–15)
BILIRUBIN TOTAL: 0.4 mg/dL (ref 0.3–1.2)
BUN: 11 mg/dL (ref 6–20)
CALCIUM: 8.7 mg/dL — AB (ref 8.9–10.3)
CO2: 23 mmol/L (ref 22–32)
Chloride: 109 mmol/L (ref 101–111)
Creatinine, Ser: 1 mg/dL (ref 0.61–1.24)
GFR calc Af Amer: >60 mL/min (ref >60)
Glucose, Bld: 88 mg/dL (ref 65–99)
POTASSIUM: 3.7 mmol/L (ref 3.5–5.1)
Sodium: 135 mmol/L (ref 135–145)
TOTAL PROTEIN: 7.2 g/dL (ref 6.5–8.1)

## 2017-11-25 LAB — URINALYSIS, COMPLETE (UACMP) WITH MICROSCOPIC
BILIRUBIN URINE: NEGATIVE
Bacteria, UA: NONE SEEN
GLUCOSE, UA: NEGATIVE mg/dL
Hgb urine dipstick: NEGATIVE
KETONES UR: NEGATIVE mg/dL
Leukocytes, UA: NEGATIVE
NITRITE: NEGATIVE
PH: 7 (ref 5.0–8.0)
Protein, ur: NEGATIVE mg/dL
SPECIFIC GRAVITY, URINE: 1.02 (ref 1.005–1.030)
Squamous Epithelial / LPF: NONE SEEN

## 2017-11-25 LAB — RAPID HIV SCREEN (HIV 1/2 AB+AG)
HIV 1/2 Antibodies: NONREACTIVE
HIV-1 P24 Antigen - HIV24: NONREACTIVE

## 2017-11-25 LAB — CBC
HCT: 48.9 % (ref 40.0–52.0)
HEMOGLOBIN: 15.7 g/dL (ref 13.0–18.0)
MCH: 27.6 pg (ref 26.0–34.0)
MCHC: 32 g/dL (ref 32.0–36.0)
MCV: 86.1 fL (ref 80.0–100.0)
Platelets: 390 10*3/uL (ref 150–440)
RBC: 5.68 MIL/uL (ref 4.40–5.90)
RDW: 14.1 % (ref 11.5–14.5)
WBC: 10.1 10*3/uL (ref 3.8–10.6)

## 2017-11-25 LAB — LIPASE, BLOOD: LIPASE: 22 U/L (ref 11–51)

## 2017-11-25 MED ORDER — KETOROLAC TROMETHAMINE 60 MG/2ML IM SOLN
15.0000 mg | Freq: Once | INTRAMUSCULAR | Status: AC
  Administered 2017-11-25: 15 mg via INTRAMUSCULAR
  Filled 2017-11-25: qty 2

## 2017-11-25 NOTE — ED Triage Notes (Signed)
Pt in via POV with complaints of abdominal cramps x one month, becoming more severe over the last week.  Pt also reports passing bright red blood in stool x months, denies any hx of hemorrhoids.  Pt with recent colonoscopy, exam unremarkable.  NAD noted at this time.

## 2017-11-25 NOTE — ED Notes (Signed)
Deja, RN notified that patient will need a recollect on his green top per lab request.

## 2017-11-25 NOTE — ED Provider Notes (Signed)
[facility] Emergency Department Provider Note  ____________________________________________  Time seen: Approximately 9:09 PM  I have reviewed the triage vital signs and the nursing notes.   HISTORY  Chief Complaint Abdominal Pain    HPI Ronnie Smith. is a 21 y.o. male who complains of generalized abdominal cramping for several months associated with an episode of rectal bleeding occasionally over the last several months. No aggravating or alleviating factors, moderate intensity, nonradiating. Was evaluated in February of this year 10 months ago for similar symptoms. Had a colonoscopy which revealed some rectal condyloma but otherwise unremarkable. Denies any chest pain dizziness syncope.  He does participate in exclusively receptive anal intercourse     Past Medical History:  Diagnosis Date  . Abdominal pain, generalized   . Blood in stool   . Colitis   . Complication of anesthesia    Prolonged Emergence  . Hemorrhage pharyngeal 10/04/2015  . HTN (hypertension) 10/05/2015  . Noninfectious diarrhea   . Perianal abscess 07/19/2015  . Perirectal abscess 04/17/2015  . S/P tonsillectomy 10/02/2015  . Tonsillitis      Patient Active Problem List   Diagnosis Date Noted  . Abdominal pain, generalized   . Blood in stool   . Noninfectious diarrhea   . Other specified diseases of anus and rectum (CODE)   . Post tonsillectomy secondary hemorrhage 10/05/2015  . HTN (hypertension) 10/05/2015  . Hemorrhage pharyngeal 10/04/2015  . S/P tonsillectomy 10/02/2015  . Perianal abscess 07/19/2015  . Perirectal abscess 04/17/2015     Past Surgical History:  Procedure Laterality Date  . COLONOSCOPY WITH PROPOFOL N/A 01/15/2017   Procedure: COLONOSCOPY WITH PROPOFOL;  Surgeon: Lucilla Lame, MD;  Location: [place];  Service: Endoscopy;  Laterality: N/A;  . INCISION AND DRAINAGE PERIRECTAL ABSCESS N/A 04/19/2015   Procedure: IRRIGATION AND  DEBRIDEMENT PERIRECTAL ABSCESS;  Surgeon: Florene Glen, MD;  Location: [facility];  Service: General;  Laterality: N/A;  . INCISION AND DRAINAGE PERIRECTAL ABSCESS  12-2014  . INCISION AND DRAINAGE PERIRECTAL ABSCESS N/A 01/18/2017   Procedure: IRRIGATION AND DEBRIDEMENT PERIRECTAL ABSCESS;  Surgeon: Robert Bellow, MD;  Location: [facility];  Service: General;  Laterality: N/A;  . POLYPECTOMY  01/15/2017   Procedure: POLYPECTOMY;  Surgeon: Lucilla Lame, MD;  Location: [place];  Service: Endoscopy;;  . TONSILLECTOMY N/A 10/04/2015   Procedure:  control of TONSILLECTOMY hemorrage ;  Surgeon: Carloyn Manner, MD;  Location: [facility];  Service: ENT;  Laterality: N/A;  . TONSILLECTOMY AND ADENOIDECTOMY N/A 10/02/2015   Procedure: TONSILLECTOMY AND ADENOIDECTOMY;  Surgeon: Beverly Gust, MD;  Location: [facility];  Service: ENT;  Laterality: N/A;  . TONSILLECTOMY AND ADENOIDECTOMY N/A 10/04/2015   Procedure: control TONSILLECTOMY hemorrage;  Surgeon: Clyde Canterbury, MD;  Location: [facility];  Service: ENT;  Laterality: N/A;     Prior to Admission medications   Medication Sig Start Date End Date Taking? Authorizing Provider  ketorolac (TORADOL) 10 MG tablet Take 1 tablet (10 mg total) by mouth every 6 (six) hours as needed. 06/01/17   Harvest Dark, MD     Allergies Doxycycline and Zofran Alvis Lemmings hcl]   Family History  Problem Relation Age of Onset  . Healthy Mother   . Diabetes Father   . Hypertension Father   . Cancer Neg Hx     Social History Social History   Tobacco Use  . Smoking status: Never Smoker  . Smokeless tobacco: Never Used  Substance Use Topics  . Alcohol  use: No  . Drug use: No    Review of Systems  Constitutional:   No fever or chills.  ENT:   No sore throat. No rhinorrhea. Cardiovascular:   No chest pain or syncope. Respiratory:   No dyspnea or cough. Gastrointestinal:   positive abdominal pain as above without vomiting or diarrhea or  constipation.  Musculoskeletal:   Negative for focal pain or swelling All other systems reviewed and are negative except as documented above in ROS and HPI.  ____________________________________________   PHYSICAL EXAM:  VITAL SIGNS: ED Triage Vitals  Enc Vitals Group     BP 11/25/17 1751 (!) 169/103     Pulse Rate 11/25/17 1751 90     Resp 11/25/17 1751 16     Temp 11/25/17 1751 (!) 97.5 F (36.4 C)     Temp Source 11/25/17 1751 Oral     SpO2 11/25/17 1751 99 %     Weight 11/25/17 1752 (!) 349 lb (158.3 kg)     Height 11/25/17 1752 6\' 2"  (1.88 m)     Head Circumference --      Peak Flow --      Pain Score 11/25/17 1751 8     Pain Loc --      Pain Edu? --      Excl. in [place]? --     Vital signs reviewed, nursing assessments reviewed.   Constitutional:   Alert and oriented. Well appearing and in no distress. Eyes:   No scleral icterus.  EOMI. No nystagmus. No conjunctival pallor. PERRL. ENT   Head:   Normocephalic and atraumatic.   Nose:   No congestion/rhinnorhea.    Mouth/Throat:   MMM, no pharyngeal erythema. No peritonsillar mass. no oral lesions   Neck:   No meningismus. Full ROM. Hematological/Lymphatic/Immunilogical:   No cervical lymphadenopathy. Cardiovascular:   RRR. Symmetric bilateral radial and DP pulses.  No murmurs.  Respiratory:   Normal respiratory effort without tachypnea/retractions. Breath sounds are clear and equal bilaterally. No wheezes/rales/rhonchi. Gastrointestinal:   Soft and nontender. Non distended. There is no CVA tenderness.  No rebound, rigidity, or guarding.rectal exam performed with nurse Deja at bedside. No fissures or hemorrhoids. Few small cutaneous condyloma present. Nontender, brown stool, Hemoccult negative, control is okay. Anal swab collected for GC chlamydia testing Genitourinary:   deferred Musculoskeletal:   Normal range of motion in all extremities. No joint effusions.  No lower extremity tenderness.  No  edema. Neurologic:   Normal speech and language.  Motor grossly intact. No gross focal neurologic deficits are appreciated.    ____________________________________________    LABS (pertinent positives/negatives) (all labs ordered are listed, but only abnormal results are displayed) Labs Reviewed  URINALYSIS, COMPLETE (UACMP) WITH MICROSCOPIC - Abnormal; Notable for the following components:      Result Value   Color, Urine YELLOW (*)    APPearance CLOUDY (*)    All other components within normal limits  COMPREHENSIVE METABOLIC PANEL - Abnormal; Notable for the following components:   Calcium 8.7 (*)    Anion gap 3 (*)    All other components within normal limits  CHLAMYDIA/NGC RT PCR ([facility] ONLY)  CBC  LIPASE, BLOOD  RAPID HIV SCREEN (HIV 1/2 AB+AG)  RPR  MISC [facility] TEST (SEND OUT)   ____________________________________________   EKG    ____________________________________________    RADIOLOGY  No results found.  ____________________________________________   PROCEDURES Procedures  ____________________________________________     CLINICAL IMPRESSION / ASSESSMENT AND PLAN / ED COURSE  Pertinent labs & imaging results that were available during my care of the patient were reviewed by me and considered in my medical decision making (see chart for details).   patient presents with abdominal pain, chronic. Complained of rectal bleeding but not on exam, unremarkable vital signs, normal hemoglobin. Patient primarily reports a concern for sexual transmitted infection so plan to do a screening workup for GC chlamydia HIV and syphilis.   HIV negative,syphilis pending. Lab informed us that they are completely unable to run a GC chlamydia PCR on an anal sample. I called in directly to discuss, I'm unable to understand why this isn't possible but they insist that they won't do it. I requested they send the test the [facility] if they can't perform it, which  they indicate that they will do.  patient remains calm and comfortable. I informed the patient the benefit of following up with Dr. Ola Spurr in clinic since he is at high risk for HIV transmission and may be a good candidate for preexposure prophylaxis. Patient expresses interest in this and will follow-up.     ____________________________________________   FINAL CLINICAL IMPRESSION(S) / ED DIAGNOSES    Final diagnoses:  Generalized abdominal pain  Rectal bleeding      This SmartLink is deprecated. Use AVSMEDLIST instead to display the medication list for a patient.   Portions of this note were generated with dragon dictation software. Dictation errors may occur despite best attempts at proofreading.    Carrie Mew, MD 11/25/17 2115

## 2017-11-25 NOTE — Discharge Instructions (Signed)
Your HIV test was negative. Other tests are pending and will take a few days to result. Follow up with Dr. Ola Spurr for further evaluation of your risk factors and test results.

## 2017-11-26 ENCOUNTER — Telehealth: Payer: Self-pay | Admitting: Emergency Medicine

## 2017-11-26 NOTE — Telephone Encounter (Addendum)
Called patient to inform him that the [facility] and chlamydia tests will not be done due to sent in wrong specimen container.  I gave him the hiv result and explained that the rpr is still pending.  I told him that he could have another GC/chlamydia test collected at his pcp if  He desires.    11/30/2017--called patient due to RPR is reactive with positive T pallidum.  Would like him to return or go to urgent care or pcp for treatment for that.  Dr. Clearnce Hasten also recommends that he be treated for [facility] and chlamydia as testing could not be completed (see above.)  I left a message asking him to call me back.

## 2017-11-27 LAB — RPR, QUANT+TP ABS (REFLEX)
Rapid Plasma Reagin, Quant: 1:1 {titer} — ABNORMAL HIGH
T Pallidum Abs: POSITIVE — AB

## 2017-11-27 LAB — RPR: RPR Ser Ql: REACTIVE — AB

## 2017-11-30 NOTE — ED Notes (Signed)
The patient called back, informed the pt of the doctor recommendation to get treatment for Gc and chlamydia either here or with his PCP and states that he will.Marland Kitchen

## 2018-01-12 ENCOUNTER — Encounter: Payer: Self-pay | Admitting: Emergency Medicine

## 2018-01-12 ENCOUNTER — Other Ambulatory Visit: Payer: Self-pay

## 2018-01-12 ENCOUNTER — Emergency Department
Admission: EM | Admit: 2018-01-12 | Discharge: 2018-01-12 | Disposition: A | Discharge status: Home / Self Care | Payer: BLUE CROSS/BLUE SHIELD | Attending: Student in an Organized Health Care Education/Training Program | Admitting: Student in an Organized Health Care Education/Training Program

## 2018-01-12 DIAGNOSIS — R509 Fever, unspecified: Secondary | ICD-10-CM | POA: Insufficient documentation

## 2018-01-12 DIAGNOSIS — R05 Cough: Secondary | ICD-10-CM | POA: Insufficient documentation

## 2018-01-12 DIAGNOSIS — I10 Essential (primary) hypertension: Secondary | ICD-10-CM | POA: Insufficient documentation

## 2018-01-12 DIAGNOSIS — Z79899 Other long term (current) drug therapy: Secondary | ICD-10-CM | POA: Insufficient documentation

## 2018-01-12 DIAGNOSIS — A530 Latent syphilis, unspecified as early or late: Secondary | ICD-10-CM | POA: Diagnosis not present

## 2018-01-12 DIAGNOSIS — R112 Nausea with vomiting, unspecified: Secondary | ICD-10-CM | POA: Insufficient documentation

## 2018-01-12 DIAGNOSIS — J111 Influenza due to unidentified influenza virus with other respiratory manifestations: Secondary | ICD-10-CM

## 2018-01-12 DIAGNOSIS — M7918 Myalgia, other site: Secondary | ICD-10-CM | POA: Diagnosis present

## 2018-01-12 DIAGNOSIS — R197 Diarrhea, unspecified: Secondary | ICD-10-CM | POA: Insufficient documentation

## 2018-01-12 DIAGNOSIS — J09X9 Influenza due to identified novel influenza A virus with other manifestations: Secondary | ICD-10-CM | POA: Diagnosis not present

## 2018-01-12 LAB — INFLUENZA PANEL BY PCR (TYPE A & B)
INFLBPCR: NEGATIVE
Influenza A By PCR: POSITIVE — AB

## 2018-01-12 MED ORDER — AZITHROMYCIN 500 MG PO TABS
1000.0000 mg | ORAL_TABLET | Freq: Once | ORAL | Status: AC
  Administered 2018-01-12: 1000 mg via ORAL
  Filled 2018-01-12: qty 2

## 2018-01-12 MED ORDER — FLUTICASONE PROPIONATE 50 MCG/ACT NA SUSP: 2.0000 | Freq: Every day | NASAL | 0 refills | Status: AC

## 2018-01-12 MED ORDER — CEFTRIAXONE SODIUM 250 MG IJ SOLR
250.0000 mg | Freq: Once | INTRAMUSCULAR | Status: AC
  Administered 2018-01-12: 250 mg via INTRAMUSCULAR
  Filled 2018-01-12: qty 250

## 2018-01-12 MED ORDER — PROMETHAZINE HCL 25 MG PO TABS: 25.0000 mg | ORAL_TABLET | Freq: Three times a day (TID) | ORAL | 0 refills | Status: DC | PRN

## 2018-01-12 MED ORDER — ACETAMINOPHEN 500 MG PO TABS
1000.0000 mg | ORAL_TABLET | Freq: Once | ORAL | Status: AC
Start: 2018-01-12 — End: 2018-01-12
  Administered 2018-01-12: 1000 mg via ORAL
  Filled 2018-01-12: qty 2

## 2018-01-12 MED ORDER — PROMETHAZINE HCL 25 MG PO TABS
25.0000 mg | ORAL_TABLET | Freq: Once | ORAL | Status: AC
  Administered 2018-01-12: 25 mg via ORAL
  Filled 2018-01-12: qty 1

## 2018-01-12 MED ORDER — BENZONATATE 100 MG PO CAPS: ORAL_CAPSULE | ORAL | 0 refills | Status: AC

## 2018-01-12 MED ORDER — PENICILLIN G BENZATHINE 1200000 UNIT/2ML IM SUSP
2.4000 10*6.[IU] | Freq: Once | INTRAMUSCULAR | Status: AC
  Administered 2018-01-12: 2.4 10*6.[IU] via INTRAMUSCULAR
  Filled 2018-01-12: qty 4

## 2018-01-12 NOTE — ED Triage Notes (Signed)
presents with generalized body aches since Sunday  Pos n/v  Last time vomited was early am  Fever of 102 at home

## 2018-01-12 NOTE — Discharge Instructions (Signed)
You have tested positive for influenza (flu). You should continue to monitor and treat any fevers or symptoms with OTC meds. You should hydrate with non-carbonated drinks to prevent dehydration. Follow-up with Dr. Ladoris Gene for further treatment and pre-exposure prophylaxis, as discussed.

## 2018-01-12 NOTE — ED Provider Notes (Signed)
[facility] Emergency Department Provider Note ____________________________________________  Time seen: 1017  I have reviewed the triage vital signs and the nursing notes.  HISTORY  Chief Complaint  Generalized Body Aches  HPI Ronnie Smith. is a 22 y.o. male presents to the ED for evaluation of flulike symptoms.  He reports onset of symptoms on Sunday which include nausea, vomiting, diarrhea, body aches, cough, and fevers.  He denies receiving the seasonal flu vaccine.  Patient also notes that he was last seen here on December 19.  At that time he was evaluated for generalized symptoms including flulike symptoms and was found to be RPR positive on send-out labs. He was notified via phone of the results and need for treatment. He reports was unable to secure an appointment with Dr. Ola Spurr at the [facility]. He presents now requesting treatment. He denies any sexual contact since his diagnosis.   Past Medical History:  Diagnosis Date  . Abdominal pain, generalized   . Blood in stool   . Colitis   . Complication of anesthesia    Prolonged Emergence  . Hemorrhage pharyngeal 10/04/2015  . HTN (hypertension) 10/05/2015  . Noninfectious diarrhea   . Perianal abscess 07/19/2015  . Perirectal abscess 04/17/2015  . S/P tonsillectomy 10/02/2015  . Tonsillitis     Patient Active Problem List   Diagnosis Date Noted  . Abdominal pain, generalized   . Blood in stool   . Noninfectious diarrhea   . Other specified diseases of anus and rectum (CODE)   . Post tonsillectomy secondary hemorrhage 10/05/2015  . HTN (hypertension) 10/05/2015  . Hemorrhage pharyngeal 10/04/2015  . S/P tonsillectomy 10/02/2015  . Perianal abscess 07/19/2015  . Perirectal abscess 04/17/2015    Past Surgical History:  Procedure Laterality Date  . COLONOSCOPY WITH PROPOFOL N/A 01/15/2017   Procedure: COLONOSCOPY WITH PROPOFOL;  Surgeon: Lucilla Lame, MD;  Location: [place];   Service: Endoscopy;  Laterality: N/A;  . INCISION AND DRAINAGE PERIRECTAL ABSCESS N/A 04/19/2015   Procedure: IRRIGATION AND DEBRIDEMENT PERIRECTAL ABSCESS;  Surgeon: Florene Glen, MD;  Location: [facility];  Service: General;  Laterality: N/A;  . INCISION AND DRAINAGE PERIRECTAL ABSCESS  12-2014  . INCISION AND DRAINAGE PERIRECTAL ABSCESS N/A 01/18/2017   Procedure: IRRIGATION AND DEBRIDEMENT PERIRECTAL ABSCESS;  Surgeon: Robert Bellow, MD;  Location: [facility];  Service: General;  Laterality: N/A;  . POLYPECTOMY  01/15/2017   Procedure: POLYPECTOMY;  Surgeon: Lucilla Lame, MD;  Location: [place];  Service: Endoscopy;;  . TONSILLECTOMY N/A 10/04/2015   Procedure:  control of TONSILLECTOMY hemorrage ;  Surgeon: Carloyn Manner, MD;  Location: [facility];  Service: ENT;  Laterality: N/A;  . TONSILLECTOMY AND ADENOIDECTOMY N/A 10/02/2015   Procedure: TONSILLECTOMY AND ADENOIDECTOMY;  Surgeon: Beverly Gust, MD;  Location: [facility];  Service: ENT;  Laterality: N/A;  . TONSILLECTOMY AND ADENOIDECTOMY N/A 10/04/2015   Procedure: control TONSILLECTOMY hemorrage;  Surgeon: Clyde Canterbury, MD;  Location: [facility];  Service: ENT;  Laterality: N/A;    Prior to Admission medications   Medication Sig Start Date End Date Taking? Authorizing Provider  benzonatate (TESSALON PERLES) 100 MG capsule Take 1-2 tabs TID prn cough 01/12/18   Tamsyn Owusu, Dannielle Karvonen, PA-C  fluticasone (FLONASE) 50 MCG/ACT nasal spray Place 2 sprays into both nostrils daily. 01/12/18   Tyreona Panjwani, Dannielle Karvonen, PA-C  ketorolac (TORADOL) 10 MG tablet Take 1 tablet (10 mg total) by mouth every 6 (six) hours as needed. 06/01/17  Harvest Dark, MD  promethazine (PHENERGAN) 25 MG tablet Take 1 tablet (25 mg total) by mouth every 8 (eight) hours as needed for nausea or vomiting. 01/12/18   Dayshia Ballinas, Dannielle Karvonen, PA-C    Allergies Doxycycline and Zofran [ondansetron hcl]  Family History  Problem Relation Age of Onset  .  Healthy Mother   . Diabetes Father   . Hypertension Father   . Cancer Neg Hx     Social History Social History   Tobacco Use  . Smoking status: Never Smoker  . Smokeless tobacco: Never Used  Substance Use Topics  . Alcohol use: No  . Drug use: No    Review of Systems  Constitutional: Positive for fever and malaise. Eyes: Negative for visual changes. ENT: Negative for sore throat. Cardiovascular: Negative for chest pain. Respiratory: Negative for shortness of breath. Gastrointestinal: Negative for abdominal pain and diarrhea. Reports nausea & vomiting Genitourinary: Negative for dysuria. Musculoskeletal: Negative for back pain. Skin: Negative for rash. Neurological: Negative for headaches, focal weakness or numbness. ____________________________________________  PHYSICAL EXAM:  VITAL SIGNS: ED Triage Vitals  Enc Vitals Group     BP 01/12/18 0856 (!) 139/94     Pulse Rate 01/12/18 0856 (!) 112     Resp 01/12/18 0856 (!) 22     Temp 01/12/18 0856 (!) 100.6 F (38.1 C)     Temp Source 01/12/18 0856 Oral     SpO2 01/12/18 0856 95 %     Weight 01/12/18 0857 (!) 327 lb (148.3 kg)     Height 01/12/18 0857 6\' 2"  (1.88 m)     Head Circumference --      Peak Flow --      Pain Score 01/12/18 0900 8     Pain Loc --      Pain Edu? --      Excl. in [place]? --     Constitutional: Alert and oriented. Well appearing and in no distress. Head: Normocephalic and atraumatic. Cardiovascular: Normal rate, regular rhythm. No murmur, rub, or gallop. Normal distal pulses. Respiratory: Normal respiratory effort. No wheezes/rales/rhonchi. Musculoskeletal: Nontender with normal range of motion in all extremities.  Neurologic:  Normal gait without ataxia. Normal speech and language. No gross focal neurologic deficits are appreciated. Skin:  Skin is warm, dry and intact. No rash noted. Chronic, hypertrophic follicular scars to the intertriginous upper thighs, bilaterally.  Psychiatric: Mood  and affect are normal. Patient exhibits appropriate insight and judgment. ____________________________________________   LABS (pertinent positives/negatives)  Labs Reviewed  INFLUENZA PANEL BY PCR (TYPE A & B) - Abnormal; Notable for the following components:      Result Value   Influenza A By PCR POSITIVE (*)    All other components within normal limits  ____________________________________________  PROCEDURES  Procedures Tylenol 1000 mg PO Promethazine 25 mg PO Azithromycin 1 g PO Rocephin 250 mg IM Penicillin g benzathine 2.4 million units IM ____________________________________________  INITIAL IMPRESSION / ASSESSMENT AND PLAN / ED COURSE  ----------------------------------------- 11:12 AM on 01/12/2018 ----------------------------------------- Spoke with Dr. Ola Spurr (ID) regarding patient being lost to follow-up with previous diagnosis and treatment plan for + RPR. He suggested treatment with 2.4 million units of PenG. He also approved empiric treatment for GC/chlamydia. He suggested the patient see Dr. Ladoris Gene at [facility] for follow-up and PrEP.   Patient with a ED evaluation of flulike symptoms found to be confirmed for influenza A.  The patient is discharged prescriptions for Tessalon Perles, Flonase, and Phenergan.  He  is advised to continue to monitor and treat fevers at home using Tylenol and ibuprofen.  He may doze over-the-counter medications for symptom relief, as needed.  Patient is referred to [facility] health care for further management of his RPR.  He does verbalize understanding and seems committed to follow-up for ongoing weekly injections as discussed.  She is advised to consider receiving a seasonal flu vaccine once he is beyond his current illness.  A work note is provided as requested. ____________________________________________  FINAL CLINICAL IMPRESSION(S) / ED DIAGNOSES  Final diagnoses:  Influenza  Syphili, latent       Melvenia Needles, PA-C 01/12/18 1814    Merlyn Lot, MD 01/13/18 [phone]

## 2018-06-02 ENCOUNTER — Other Ambulatory Visit: Payer: Self-pay

## 2018-06-02 DIAGNOSIS — B349 Viral infection, unspecified: Secondary | ICD-10-CM | POA: Diagnosis not present

## 2018-06-02 DIAGNOSIS — R1031 Right lower quadrant pain: Secondary | ICD-10-CM | POA: Diagnosis present

## 2018-06-02 DIAGNOSIS — R05 Cough: Secondary | ICD-10-CM | POA: Insufficient documentation

## 2018-06-02 DIAGNOSIS — Z79899 Other long term (current) drug therapy: Secondary | ICD-10-CM | POA: Diagnosis not present

## 2018-06-02 DIAGNOSIS — F17200 Nicotine dependence, unspecified, uncomplicated: Secondary | ICD-10-CM | POA: Diagnosis not present

## 2018-06-02 DIAGNOSIS — I10 Essential (primary) hypertension: Secondary | ICD-10-CM | POA: Insufficient documentation

## 2018-06-02 LAB — URINALYSIS, COMPLETE (UACMP) WITH MICROSCOPIC
BACTERIA UA: NONE SEEN
Bilirubin Urine: NEGATIVE
GLUCOSE, UA: NEGATIVE mg/dL
Hgb urine dipstick: NEGATIVE
Ketones, ur: 5 mg/dL — AB
Leukocytes, UA: NEGATIVE
Nitrite: NEGATIVE
PROTEIN: NEGATIVE mg/dL
Specific Gravity, Urine: 1.027 (ref 1.005–1.030)
pH: 5 (ref 5.0–8.0)

## 2018-06-02 LAB — COMPREHENSIVE METABOLIC PANEL
ALK PHOS: 82 U/L (ref 38–126)
ALT: 48 U/L — ABNORMAL HIGH (ref 0–44)
ANION GAP: 8 (ref 5–15)
AST: 32 U/L (ref 15–41)
Albumin: 4.8 g/dL (ref 3.5–5.0)
BUN: 11 mg/dL (ref 6–20)
CHLORIDE: 106 mmol/L (ref 98–111)
CO2: 23 mmol/L (ref 22–32)
Calcium: 9 mg/dL (ref 8.9–10.3)
Creatinine, Ser: 1.3 mg/dL — ABNORMAL HIGH (ref 0.61–1.24)
GFR calc non Af Amer: >60 mL/min (ref >60)
GLUCOSE: 123 mg/dL — AB (ref 70–99)
POTASSIUM: 3.8 mmol/L (ref 3.5–5.1)
SODIUM: 137 mmol/L (ref 135–145)
Total Bilirubin: 0.4 mg/dL (ref 0.3–1.2)
Total Protein: 7.9 g/dL (ref 6.5–8.1)

## 2018-06-02 LAB — CBC
HEMATOCRIT: 49.4 % (ref 40.0–52.0)
HEMOGLOBIN: 16.1 g/dL (ref 13.0–18.0)
MCH: 27.9 pg (ref 26.0–34.0)
MCHC: 32.7 g/dL (ref 32.0–36.0)
MCV: 85.2 fL (ref 80.0–100.0)
Platelets: 434 10*3/uL (ref 150–440)
RBC: 5.79 MIL/uL (ref 4.40–5.90)
RDW: 14.1 % (ref 11.5–14.5)
WBC: 15.9 10*3/uL — ABNORMAL HIGH (ref 3.8–10.6)

## 2018-06-02 LAB — LIPASE, BLOOD: LIPASE: 30 U/L (ref 11–51)

## 2018-06-02 NOTE — ED Triage Notes (Signed)
Pt arrives to ED c/o with fever of 102 (denies taking anything), coughing up blood, and RLQ abd pain that all began today. Still has appendix. Denies hx of kidney stone. Denies urinary symptoms.

## 2018-06-03 ENCOUNTER — Emergency Department: Payer: BC Managed Care – PPO

## 2018-06-03 ENCOUNTER — Emergency Department
Admission: EM | Admit: 2018-06-03 | Discharge: 2018-06-03 | Disposition: A | Discharge status: Home / Self Care | Payer: BC Managed Care – PPO | Attending: Emergency Medicine | Admitting: Emergency Medicine

## 2018-06-03 DIAGNOSIS — R059 Cough, unspecified: Secondary | ICD-10-CM

## 2018-06-03 DIAGNOSIS — R1031 Right lower quadrant pain: Secondary | ICD-10-CM

## 2018-06-03 DIAGNOSIS — B349 Viral infection, unspecified: Secondary | ICD-10-CM

## 2018-06-03 DIAGNOSIS — R05 Cough: Secondary | ICD-10-CM

## 2018-06-03 MED ORDER — SODIUM CHLORIDE 0.9 % IV BOLUS
1000.0000 mL | Freq: Once | INTRAVENOUS | Status: AC
  Administered 2018-06-03: 1000 mL via INTRAVENOUS

## 2018-06-03 MED ORDER — PROMETHAZINE HCL 25 MG/ML IJ SOLN
25.0000 mg | Freq: Once | INTRAMUSCULAR | Status: AC
  Administered 2018-06-03: 25 mg via INTRAVENOUS
  Filled 2018-06-03: qty 1

## 2018-06-03 MED ORDER — PROMETHAZINE HCL 25 MG PO TABS: 25.0000 mg | ORAL_TABLET | Freq: Four times a day (QID) | ORAL | 0 refills | Status: AC | PRN

## 2018-06-03 MED ORDER — ALBUTEROL SULFATE (2.5 MG/3ML) 0.083% IN NEBU
5.0000 mg | INHALATION_SOLUTION | Freq: Once | RESPIRATORY_TRACT | Status: AC
  Administered 2018-06-03: 5 mg via RESPIRATORY_TRACT
  Filled 2018-06-03: qty 6

## 2018-06-03 MED ORDER — IOPAMIDOL (ISOVUE-300) INJECTION 61%
125.0000 mL | Freq: Once | INTRAVENOUS | Status: AC | PRN
  Administered 2018-06-03: 125 mL via INTRAVENOUS

## 2018-06-03 MED ORDER — NAPROXEN 500 MG PO TABS: 500.0000 mg | ORAL_TABLET | Freq: Two times a day (BID) | ORAL | 0 refills | Status: AC

## 2018-06-03 MED ORDER — DICYCLOMINE HCL 20 MG PO TABS: 20.0000 mg | ORAL_TABLET | Freq: Three times a day (TID) | ORAL | 0 refills | Status: AC | PRN

## 2018-06-03 MED ORDER — MORPHINE SULFATE (PF) 4 MG/ML IV SOLN
4.0000 mg | Freq: Once | INTRAVENOUS | Status: AC
  Administered 2018-06-03: 4 mg via INTRAVENOUS
  Filled 2018-06-03: qty 1

## 2018-06-03 NOTE — ED Notes (Signed)
Patient transported to X-ray 

## 2018-06-03 NOTE — ED Provider Notes (Signed)
[facility] Emergency Department Provider Note  ____________________________________________  Time seen: Approximately 1:41 AM  I have reviewed the triage vital signs and the nursing notes.   HISTORY  Chief Complaint Cough and Abdominal Pain    HPI Ronnie Smith. is a 22 y.o. male with a history of hypertension and colitis who complains of right lower quadrant abdominal pain that began 2 days ago.  Gradual onset, constant, worsening, associated with nausea and loss of appetite.  Nonradiating.  No aggravating or alleviating factors.  Moderate intensity and aching.  Also reports a fever to 102 at home.  Also has nasal congestion, body aches, nonproductive cough for the past 24 hours.      Past Medical History:  Diagnosis Date  . Abdominal pain, generalized   . Blood in stool   . Colitis   . Complication of anesthesia    Prolonged Emergence  . Hemorrhage pharyngeal 10/04/2015  . HTN (hypertension) 10/05/2015  . Noninfectious diarrhea   . Perianal abscess 07/19/2015  . Perirectal abscess 04/17/2015  . S/P tonsillectomy 10/02/2015  . Tonsillitis      Patient Active Problem List   Diagnosis Date Noted  . Abdominal pain, generalized   . Blood in stool   . Noninfectious diarrhea   . Other specified diseases of anus and rectum (CODE)   . Post tonsillectomy secondary hemorrhage 10/05/2015  . HTN (hypertension) 10/05/2015  . Hemorrhage pharyngeal 10/04/2015  . S/P tonsillectomy 10/02/2015  . Perianal abscess 07/19/2015  . Perirectal abscess 04/17/2015     Past Surgical History:  Procedure Laterality Date  . COLONOSCOPY WITH PROPOFOL N/A 01/15/2017   Procedure: COLONOSCOPY WITH PROPOFOL;  Surgeon: Lucilla Lame, MD;  Location: [place];  Service: Endoscopy;  Laterality: N/A;  . INCISION AND DRAINAGE PERIRECTAL ABSCESS N/A 04/19/2015   Procedure: IRRIGATION AND DEBRIDEMENT PERIRECTAL ABSCESS;  Surgeon: Florene Glen, MD;  Location: [facility]  ORS;  Service: General;  Laterality: N/A;  . INCISION AND DRAINAGE PERIRECTAL ABSCESS  12-2014  . INCISION AND DRAINAGE PERIRECTAL ABSCESS N/A 01/18/2017   Procedure: IRRIGATION AND DEBRIDEMENT PERIRECTAL ABSCESS;  Surgeon: Robert Bellow, MD;  Location: [facility] ORS;  Service: General;  Laterality: N/A;  . POLYPECTOMY  01/15/2017   Procedure: POLYPECTOMY;  Surgeon: Lucilla Lame, MD;  Location: [place];  Service: Endoscopy;;  . TONSILLECTOMY N/A 10/04/2015   Procedure:  control of TONSILLECTOMY hemorrage ;  Surgeon: Carloyn Manner, MD;  Location: [facility] ORS;  Service: ENT;  Laterality: N/A;  . TONSILLECTOMY AND ADENOIDECTOMY N/A 10/02/2015   Procedure: TONSILLECTOMY AND ADENOIDECTOMY;  Surgeon: Beverly Gust, MD;  Location: [facility] ORS;  Service: ENT;  Laterality: N/A;  . TONSILLECTOMY AND ADENOIDECTOMY N/A 10/04/2015   Procedure: control TONSILLECTOMY hemorrage;  Surgeon: Clyde Canterbury, MD;  Location: [facility]S;  Service: ENT;  Laterality: N/A;     Prior to Admission medications   Medication Sig Start Date End Date Taking? Authorizing Provider  benzonatate (TESSALON PERLES) 100 MG capsule Take 1-2 tabs TID prn cough 01/12/18   Menshew, Dannielle Karvonen, PA-C  dicyclomine (BENTYL) 20 MG tablet Take 1 tablet (20 mg total) by mouth 3 (three) times daily as needed for spasms. 06/03/18   Carrie Mew, MD  fluticasone [facility]) 50 MCG/ACT nasal spray Place 2 sprays into both nostrils daily. 01/12/18   Menshew, Dannielle Karvonen, PA-C  ketorolac (TORADOL) 10 MG tablet Take 1 tablet (10 mg total) by mouth every 6 (six) hours as needed. 06/01/17   Paduchowski,  Lennette Bihari, MD  naproxen (NAPROSYN) 500 MG tablet Take 1 tablet (500 mg total) by mouth 2 (two) times daily with a meal. 06/03/18   Carrie Mew, MD  promethazine (PHENERGAN) 25 MG tablet Take 1 tablet (25 mg total) by mouth every 6 (six) hours as needed for nausea or vomiting. 06/03/18   Carrie Mew, MD     Allergies Doxycycline and Zofran  Alvis Lemmings hcl]   Family History  Problem Relation Age of Onset  . Healthy Mother   . Diabetes Father   . Hypertension Father   . Cancer Neg Hx     Social History Social History   Tobacco Use  . Smoking status: Current Every Day Smoker  . Smokeless tobacco: Never Used  Substance Use Topics  . Alcohol use: No  . Drug use: No    Review of Systems  Constitutional: Positive fever.  ENT:   No sore throat.  Positive rhinorrhea. Cardiovascular:   No chest pain or syncope. Respiratory:   Positive shortness of breath, positive nonproductive cough. Gastrointestinal:   Positive for abdominal pain.  No vomiting or diarrhea.  .  Musculoskeletal:   Negative for focal pain or swelling All other systems reviewed and are negative except as documented above in ROS and HPI.  ____________________________________________   PHYSICAL EXAM:  VITAL SIGNS: ED Triage Vitals  Enc Vitals Group     BP 06/02/18 1951 140/88     Pulse Rate 06/02/18 1951 (!) 138     Resp 06/02/18 1951 18     Temp 06/02/18 1951 100.2 F (37.9 C)     Temp Source 06/02/18 1951 Oral     SpO2 06/02/18 1951 96 %     Weight 06/02/18 1952 (!) 326 lb (147.9 kg)     Height 06/02/18 1952 6\' 3"  (1.905 m)     Head Circumference --      Peak Flow --      Pain Score 06/02/18 1951 9     Pain Loc --      Pain Edu? --      Excl. in [place]? --     Vital signs reviewed, nursing assessments reviewed.   Constitutional:   Alert and oriented. Non-toxic appearance. Eyes:   Conjunctivae are normal. EOMI. PERRL. ENT      Head:   Normocephalic and atraumatic.      Nose:   No congestion/rhinnorhea.       Mouth/Throat:   MMM, no pharyngeal erythema. No peritonsillar mass.       Neck:   No meningismus. Full ROM. Hematological/Lymphatic/Immunilogical:   No cervical lymphadenopathy. Cardiovascular:   Tachycardia heart rate 120. Symmetric bilateral radial and DP pulses.  No murmurs.  Respiratory:   Normal respiratory effort without  tachypnea/retractions.  End expiratory wheezing bilaterally.  Diminished breath sounds in right base.  No focal crackles Gastrointestinal:   Soft with focal right lower quadrant tenderness . Non distended. There is no CVA tenderness.  No rebound, rigidity, or guarding.  Musculoskeletal:   Normal range of motion in all extremities. No joint effusions.  No lower extremity tenderness.  No edema. Neurologic:   Normal speech and language.  Motor grossly intact. No acute focal neurologic deficits are appreciated.  Skin:    Skin is warm, dry and intact. No rash noted.  No petechiae, purpura, or bullae.  ____________________________________________    LABS (pertinent positives/negatives) (all labs ordered are listed, but only abnormal results are displayed) Labs Reviewed  COMPREHENSIVE METABOLIC PANEL - Abnormal; Notable for  the following components:      Result Value   Glucose, Bld 123 (*)    Creatinine, Ser 1.30 (*)    ALT 48 (*)    All other components within normal limits  CBC - Abnormal; Notable for the following components:   WBC 15.9 (*)    All other components within normal limits  URINALYSIS, COMPLETE (UACMP) WITH MICROSCOPIC - Abnormal; Notable for the following components:   Color, Urine YELLOW (*)    APPearance CLEAR (*)    Ketones, ur 5 (*)    All other components within normal limits  LIPASE, BLOOD   ____________________________________________   EKG    ____________________________________________    RADIOLOGY  Dg Chest 2 View  Result Date: 06/03/2018 CLINICAL DATA:  Fever to 102 with cough and hemoptysis. Right lower quadrant pain. EXAM: CHEST - 2 VIEW COMPARISON:  06/01/2017 FINDINGS: The heart size and mediastinal contours are within normal limits. Both lungs are clear. The visualized skeletal structures are unremarkable. IMPRESSION: No active cardiopulmonary disease. Electronically Signed   By: Ashley Royalty M.D.   On: 06/03/2018 02:31   Ct Abdomen Pelvis W  Contrast  Result Date: 06/03/2018 CLINICAL DATA:  Right lower quadrant pain and fever beginning today. EXAM: CT ABDOMEN AND PELVIS WITH CONTRAST TECHNIQUE: Multidetector CT imaging of the abdomen and pelvis was performed using the standard protocol following bolus administration of intravenous contrast. CONTRAST:  139mL ISOVUE-300 IOPAMIDOL (ISOVUE-300) INJECTION 61% COMPARISON:  06/01/2017 FINDINGS: Lower chest: Normal size heart. No acute pulmonary consolidation, effusion or pneumothorax at either lung base. Hepatobiliary: No focal liver abnormality is seen. No gallstones, gallbladder wall thickening, or biliary dilatation. Pancreas: Unremarkable. No pancreatic ductal dilatation or surrounding inflammatory changes. Spleen: Normal in size without focal abnormality. Adrenals/Urinary Tract: Adrenal glands are unremarkable. Kidneys are normal, without renal calculi, focal lesion, or hydronephrosis. Bladder is unremarkable. Stomach/Bowel: Normal appearing appendix. No bowel obstruction. Physiologic distention of the stomach with normal small bowel rotation. Mild fluid-filled slightly distended loops of jejunum suspicious for mild enteritis. Unremarkable appearance of the colon. Vascular/Lymphatic: No significant vascular findings are present. No enlarged abdominal or pelvic lymph nodes. Reproductive: Prostate is unremarkable. Other: No abdominal wall hernia or abnormality. No abdominopelvic ascites. Musculoskeletal: No acute or significant osseous findings. IMPRESSION: Mild fluid-filled distention of proximal small bowel compatible with enteritis. Normal appendix. Electronically Signed   By: Ashley Royalty M.D.   On: 06/03/2018 02:44    ____________________________________________   PROCEDURES Procedures  ____________________________________________  DIFFERENTIAL DIAGNOSIS   Appendicitis, enteritis, mesenteric adenitis, viral syndrome, pneumonia  CLINICAL IMPRESSION / ASSESSMENT AND PLAN / ED  COURSE  Pertinent labs & imaging results that were available during my care of the patient were reviewed by me and considered in my medical decision making (see chart for details).    Patient not in distress but uncomfortable.  Tachycardic, presents with abdominal pain concerning for appendicitis.  Also has constellation of other symptoms suggestive of viral syndrome.  Obtain a chest x-ray and a CT scan of the abdomen and pelvis to further evaluate.  IV fluids for hydration, albuterol for wheezing.  Clinical Course as of Jun 04 323  Thu Jun 03, 2018  0255 CT consistent with enteritis, compatible with overall viral syndrome.  Plan for symptomatic support, discharge home follow-up with primary care   [PS]  0255 Chest x-ray unremarkable   [PS]    Clinical Course User Index [PS] Carrie Mew, MD     ----------------------------------------- 3:23 AM on 06/03/2018 -----------------------------------------  Patient improved, tachycardia improved to 105, I suspect his tachycardia was due to dehydration from GI losses.  He is nontoxic, suitable for discharge home and outpatient follow-up.  Informed of all results.  ____________________________________________   FINAL CLINICAL IMPRESSION(S) / ED DIAGNOSES    Final diagnoses:  Viral syndrome  Right lower quadrant abdominal pain  Cough     ED Discharge Orders        Ordered    naproxen (NAPROSYN) 500 MG tablet  2 times daily with meals     06/03/18 0323    dicyclomine (BENTYL) 20 MG tablet  3 times daily PRN     06/03/18 0323    promethazine (PHENERGAN) 25 MG tablet  Every 6 hours PRN     06/03/18 0323      Portions of this note were generated with dragon dictation software. Dictation errors may occur despite best attempts at proofreading.    Carrie Mew, MD 06/03/18 [phone]

## 2018-08-26 ENCOUNTER — Emergency Department
Admission: EM | Admit: 2018-08-26 | Discharge: 2018-08-26 | Disposition: A | Discharge status: Home / Self Care | Payer: BC Managed Care – PPO | Attending: Emergency Medicine | Admitting: Emergency Medicine

## 2018-08-26 ENCOUNTER — Emergency Department: Payer: BC Managed Care – PPO

## 2018-08-26 ENCOUNTER — Other Ambulatory Visit: Payer: Self-pay

## 2018-08-26 ENCOUNTER — Encounter: Payer: Self-pay | Admitting: Emergency Medicine

## 2018-08-26 DIAGNOSIS — Z79899 Other long term (current) drug therapy: Secondary | ICD-10-CM | POA: Diagnosis not present

## 2018-08-26 DIAGNOSIS — I10 Essential (primary) hypertension: Secondary | ICD-10-CM | POA: Insufficient documentation

## 2018-08-26 DIAGNOSIS — R222 Localized swelling, mass and lump, trunk: Secondary | ICD-10-CM | POA: Diagnosis present

## 2018-08-26 DIAGNOSIS — F17200 Nicotine dependence, unspecified, uncomplicated: Secondary | ICD-10-CM | POA: Diagnosis not present

## 2018-08-26 DIAGNOSIS — L0231 Cutaneous abscess of buttock: Secondary | ICD-10-CM | POA: Diagnosis not present

## 2018-08-26 LAB — URINALYSIS, COMPLETE (UACMP) WITH MICROSCOPIC
BILIRUBIN URINE: NEGATIVE
Bacteria, UA: NONE SEEN
GLUCOSE, UA: NEGATIVE mg/dL
HGB URINE DIPSTICK: NEGATIVE
KETONES UR: NEGATIVE mg/dL
LEUKOCYTES UA: NEGATIVE
Nitrite: NEGATIVE
PH: 7 (ref 5.0–8.0)
PROTEIN: NEGATIVE mg/dL
Specific Gravity, Urine: 1.021 (ref 1.005–1.030)

## 2018-08-26 MED ORDER — SULFAMETHOXAZOLE-TRIMETHOPRIM 800-160 MG PO TABS: 1.0000 | ORAL_TABLET | Freq: Two times a day (BID) | ORAL | 0 refills | Status: AC

## 2018-08-26 MED ORDER — CEPHALEXIN 500 MG PO CAPS: 500.0000 mg | ORAL_CAPSULE | Freq: Three times a day (TID) | ORAL | 0 refills | Status: AC

## 2018-08-26 NOTE — Discharge Instructions (Addendum)
Follow-up with your regular doctor if not better in 3 to 5 days.  Soak in a warm tub of water with Epson salts.  At this time there is nothing to lance.  Return if worsening.

## 2018-08-26 NOTE — ED Triage Notes (Signed)
Pt to ED via POV c/o perianal abscess. Pt states that he first noticed 3-4 days ago. Pt states that the area is very painful. Pt is in NAD at this time.

## 2018-08-26 NOTE — ED Provider Notes (Signed)
[facility] Emergency Department Provider Note  ____________________________________________   First MD Initiated Contact with Patient 08/26/18 1542     (approximate)  I have reviewed the triage vital signs and the nursing notes.   HISTORY  Chief Complaint Abscess    HPI Ronnie Gladu. is a 22 y.o. male presents emergency department complaining of a perianal abscess.  He states he notices 3 to 4 days ago.  He said surgery in the same area.  Denies fever or chills.  Is complaining of some right lower quadrant pain.  Denies any vomiting or diarrhea at this time.  He has had some blood in his stool but has a GI doctor that has done a colonoscopy.    Past Medical History:  Diagnosis Date  . Abdominal pain, generalized   . Blood in stool   . Colitis   . Complication of anesthesia    Prolonged Emergence  . Hemorrhage pharyngeal 10/04/2015  . HTN (hypertension) 10/05/2015  . Noninfectious diarrhea   . Perianal abscess 07/19/2015  . Perirectal abscess 04/17/2015  . S/P tonsillectomy 10/02/2015  . Tonsillitis     Patient Active Problem List   Diagnosis Date Noted  . Abdominal pain, generalized   . Blood in stool   . Noninfectious diarrhea   . Other specified diseases of anus and rectum (CODE)   . Post tonsillectomy secondary hemorrhage 10/05/2015  . HTN (hypertension) 10/05/2015  . Hemorrhage pharyngeal 10/04/2015  . S/P tonsillectomy 10/02/2015  . Perianal abscess 07/19/2015  . Perirectal abscess 04/17/2015    Past Surgical History:  Procedure Laterality Date  . COLONOSCOPY WITH PROPOFOL N/A 01/15/2017   Procedure: COLONOSCOPY WITH PROPOFOL;  Surgeon: Lucilla Lame, MD;  Location: [place];  Service: Endoscopy;  Laterality: N/A;  . INCISION AND DRAINAGE PERIRECTAL ABSCESS N/A 04/19/2015   Procedure: IRRIGATION AND DEBRIDEMENT PERIRECTAL ABSCESS;  Surgeon: Florene Glen, MD;  Location: [facility];  Service: General;  Laterality: N/A;    . INCISION AND DRAINAGE PERIRECTAL ABSCESS  12-2014  . INCISION AND DRAINAGE PERIRECTAL ABSCESS N/A 01/18/2017   Procedure: IRRIGATION AND DEBRIDEMENT PERIRECTAL ABSCESS;  Surgeon: Robert Bellow, MD;  Location: [facility];  Service: General;  Laterality: N/A;  . POLYPECTOMY  01/15/2017   Procedure: POLYPECTOMY;  Surgeon: Lucilla Lame, MD;  Location: [place];  Service: Endoscopy;;  . TONSILLECTOMY N/A 10/04/2015   Procedure:  control of TONSILLECTOMY hemorrage ;  Surgeon: Carloyn Manner, MD;  Location: [facility];  Service: ENT;  Laterality: N/A;  . TONSILLECTOMY AND ADENOIDECTOMY N/A 10/02/2015   Procedure: TONSILLECTOMY AND ADENOIDECTOMY;  Surgeon: Beverly Gust, MD;  Location: [facility];  Service: ENT;  Laterality: N/A;  . TONSILLECTOMY AND ADENOIDECTOMY N/A 10/04/2015   Procedure: control TONSILLECTOMY hemorrage;  Surgeon: Clyde Canterbury, MD;  Location: [facility];  Service: ENT;  Laterality: N/A;    Prior to Admission medications   Medication Sig Start Date End Date Taking? Authorizing Provider  benzonatate (TESSALON PERLES) 100 MG capsule Take 1-2 tabs TID prn cough 01/12/18   Menshew, Dannielle Karvonen, PA-C  cephALEXin (KEFLEX) 500 MG capsule Take 1 capsule (500 mg total) by mouth 3 (three) times daily. 08/26/18   Januel Doolan, Linden Dolin, PA-C  dicyclomine (BENTYL) 20 MG tablet Take 1 tablet (20 mg total) by mouth 3 (three) times daily as needed for spasms. 06/03/18   Carrie Mew, MD  fluticasone [facility]) 50 MCG/ACT nasal spray Place 2 sprays into both nostrils daily. 01/12/18   Menshew,  Dannielle Karvonen, PA-C  ketorolac (TORADOL) 10 MG tablet Take 1 tablet (10 mg total) by mouth every 6 (six) hours as needed. 06/01/17   Harvest Dark, MD  naproxen (NAPROSYN) 500 MG tablet Take 1 tablet (500 mg total) by mouth 2 (two) times daily with a meal. 06/03/18   Carrie Mew, MD  promethazine (PHENERGAN) 25 MG tablet Take 1 tablet (25 mg total) by mouth every 6 (six) hours as needed for nausea  or vomiting. 06/03/18   Carrie Mew, MD  sulfamethoxazole-trimethoprim (BACTRIM DS,SEPTRA DS) 800-160 MG tablet Take 1 tablet by mouth 2 (two) times daily. 08/26/18   Versie Starks, PA-C    Allergies Doxycycline and Zofran Alvis Lemmings hcl]  Family History  Problem Relation Age of Onset  . Healthy Mother   . Diabetes Father   . Hypertension Father   . Cancer Neg Hx     Social History Social History   Tobacco Use  . Smoking status: Current Every Day Smoker  . Smokeless tobacco: Never Used  Substance Use Topics  . Alcohol use: No  . Drug use: No    Review of Systems  Constitutional: No fever/chills Eyes: No visual changes. ENT: No sore throat. Respiratory: Denies cough Gastrointestinal: Positive right lower quadrant pain Genitourinary: Negative for dysuria. Musculoskeletal: Negative for back pain. Skin: Negative for rash.  Positive for abscess on the lower right buttock    ____________________________________________   PHYSICAL EXAM:  VITAL SIGNS: ED Triage Vitals  Enc Vitals Group     BP 08/26/18 1724 (!) 141/75     Pulse Rate 08/26/18 1530 (!) 104     Resp 08/26/18 1530 16     Temp 08/26/18 1530 99.7 F (37.6 C)     Temp Source 08/26/18 1530 Oral     SpO2 08/26/18 1530 99 %     Weight 08/26/18 1531 (!) 327 lb (148.3 kg)     Height 08/26/18 1531 6\' 2"  (1.88 m)     Head Circumference --      Peak Flow --      Pain Score 08/26/18 1531 7     Pain Loc --      Pain Edu? --      Excl. in [place]? --     Constitutional: Alert and oriented. Well appearing and in no acute distress. Eyes: Conjunctivae are normal.  Head: Atraumatic. Nose: No congestion/rhinnorhea. Mouth/Throat: Mucous membranes are moist.   Neck:  supple no lymphadenopathy noted Cardiovascular: Normal rate, regular rhythm. Heart sounds are normal Respiratory: Normal respiratory effort.  No retractions, lungs c t a  Abd: soft tender in the right lower quadrant Bs normal all 4 quad GU:  deferred Musculoskeletal: FROM all extremities, warm and well perfused Neurologic:  Normal speech and language.  Skin:  Skin is warm, dry and intact. No rash noted.  Positive for a small reddened area on the right lower buttock.  No fluctuance or induration noted.  No drainage is noted. Psychiatric: Mood and affect are normal. Speech and behavior are normal.  ____________________________________________   LABS (all labs ordered are listed, but only abnormal results are displayed)  Labs Reviewed  URINALYSIS, COMPLETE (UACMP) WITH MICROSCOPIC - Abnormal; Notable for the following components:      Result Value   Color, Urine YELLOW (*)    APPearance CLEAR (*)    All other components within normal limits   ____________________________________________   ____________________________________________  RADIOLOGY  CT of the abdomen/pelvis is negative for appendicitis or renal stone  ____________________________________________   PROCEDURES  Procedure(s) performed: No  Procedures    ____________________________________________   INITIAL IMPRESSION / ASSESSMENT AND PLAN / ED COURSE  Pertinent labs & imaging results that were available during my care of the patient were reviewed by me and considered in my medical decision making (see chart for details).   Patient is a 22 year old male presents emergency department complaining of an abscess on the right buttock and right lower quadrant pain.  Physical exam patient appears well.  The abdomen is soft and tender in the right lower quadrant.  No rebound is noted.  The right buttock has a small red area but no noted abscess.  No fluctuance or induration is noted.  Discussed the exam findings with the patient.  A CT of the abdomen pelvis was ordered.  CT the abdomen pelvis is negative for appendicitis.  Results were discussed with patient.  He was given a prescription for Septra and Keflex for the possible abscess.  He is to  follow-up with his regular doctor if not better in 3 to 5 days return to the emergency department worsening.  He is to follow-up with a GI doctor for the bright red rectal bleeding that he noted on his toilet paper earlier this week.  He states he understands will comply.  Was discharged in stable condition     As part of my medical decision making, I reviewed the following data within the [facility] notes reviewed and incorporated, Old chart reviewed, Radiograph reviewed CT of the abdomen/pelvis is negative for appendicitis, Notes from prior ED visits and [place] Controlled Substance Database  ____________________________________________   FINAL CLINICAL IMPRESSION(S) / ED DIAGNOSES  Final diagnoses:  Abscess of buttock, right      NEW MEDICATIONS STARTED DURING THIS VISIT:  Discharge Medication List as of 08/26/2018  5:23 PM    START taking these medications   Details  cephALEXin (KEFLEX) 500 MG capsule Take 1 capsule (500 mg total) by mouth 3 (three) times daily., Starting Thu 08/26/2018, Normal    sulfamethoxazole-trimethoprim (BACTRIM DS,SEPTRA DS) 800-160 MG tablet Take 1 tablet by mouth 2 (two) times daily., Starting Thu 08/26/2018, Normal         Note:  This document was prepared using Dragon voice recognition software and may include unintentional dictation errors.    Versie Starks, PA-C 08/26/18 2039    Schaevitz, Randall An, MD 08/26/18 [phone]

## 2018-09-25 IMAGING — CT CT RENAL STONE PROTOCOL
2 of 4 series · 15 of 46 positions shown, 17 images · non-contrast
Comparison: June 03, 2018

CLINICAL DATA: Right-sided flank pain with nausea and diarrhea

EXAM:
CT ABDOMEN AND PELVIS WITHOUT CONTRAST
TECHNIQUE: Multidetector CT imaging of the abdomen and pelvis was performed
following the standard protocol without oral or IV contrast.

[Series 2: stone full standard · axial · 0.85mm/px · z∈[-752,-182]mm · 12 of 126 slices shown, 14 images]
[im 6/126  soft-tissue]
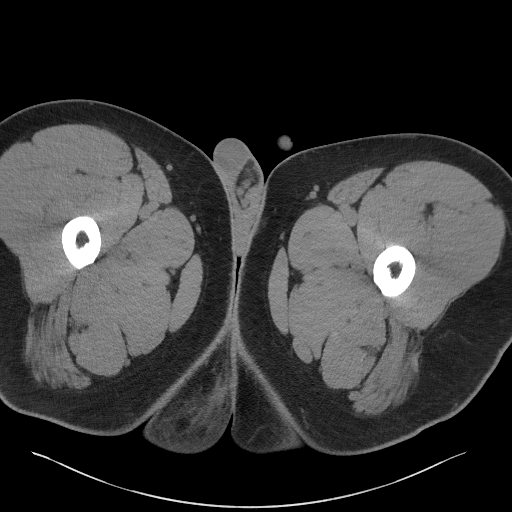
[im 6/126  bone]
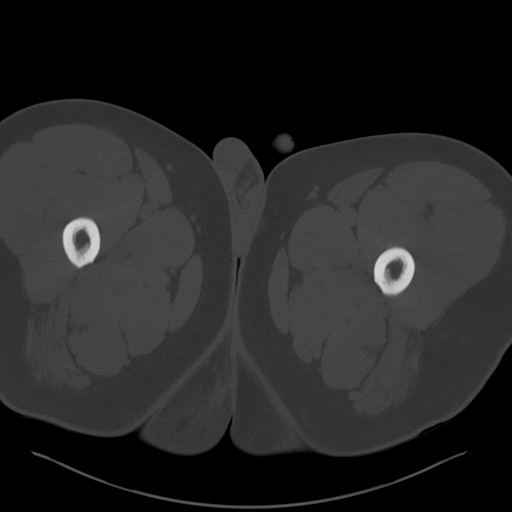
[im 16/126  soft-tissue]
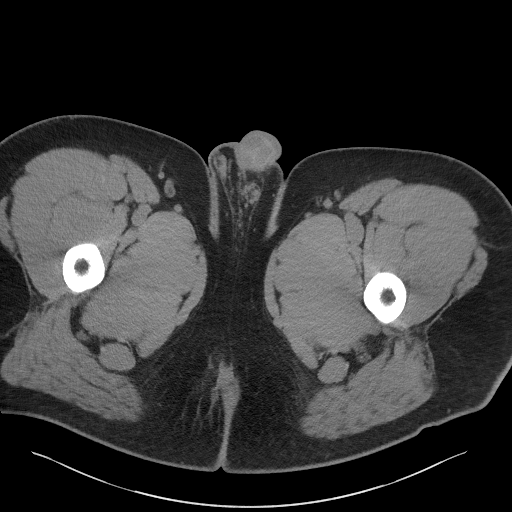
[im 27/126  soft-tissue]
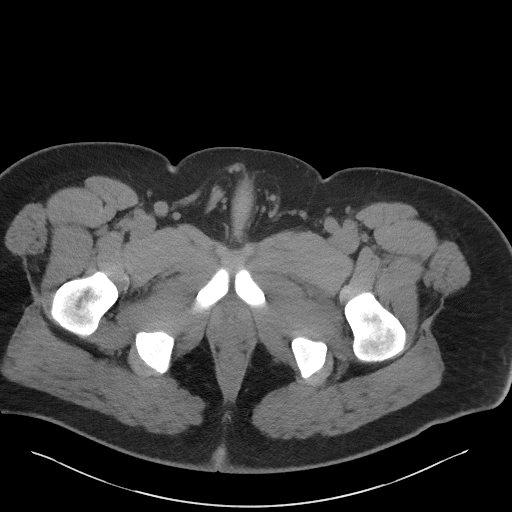
[im 37/126  soft-tissue]
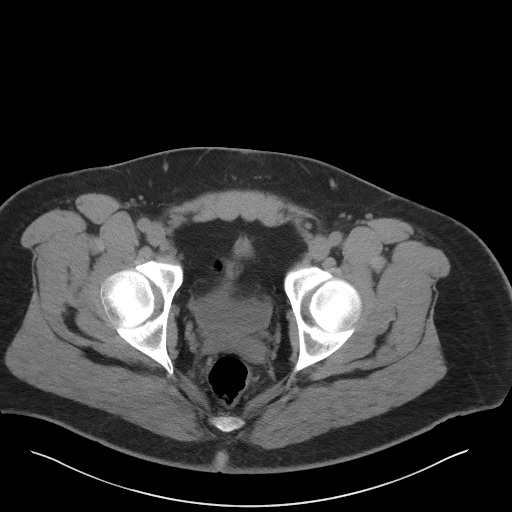
[im 47/126  soft-tissue]
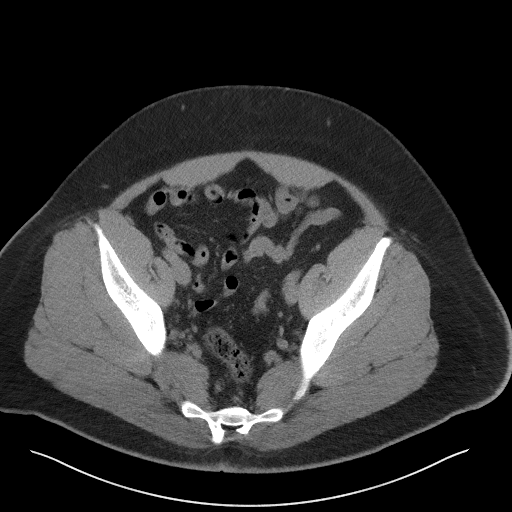
[im 58/126  soft-tissue]
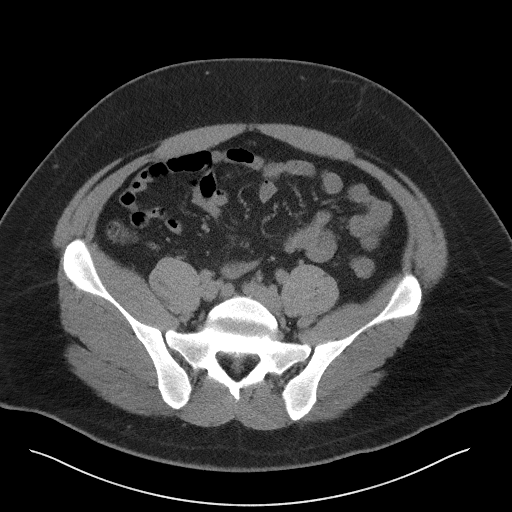
[im 68/126  soft-tissue]
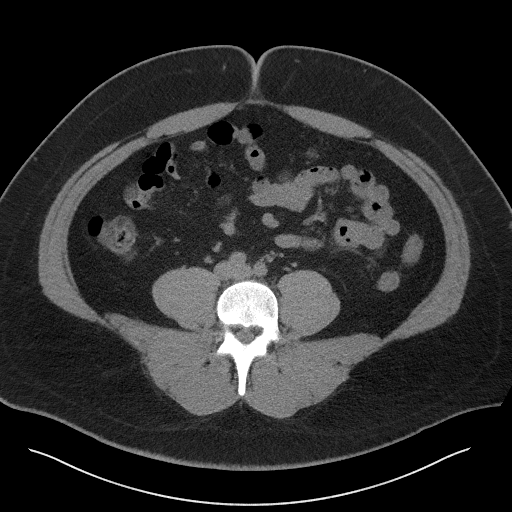
[im 79/126  soft-tissue]
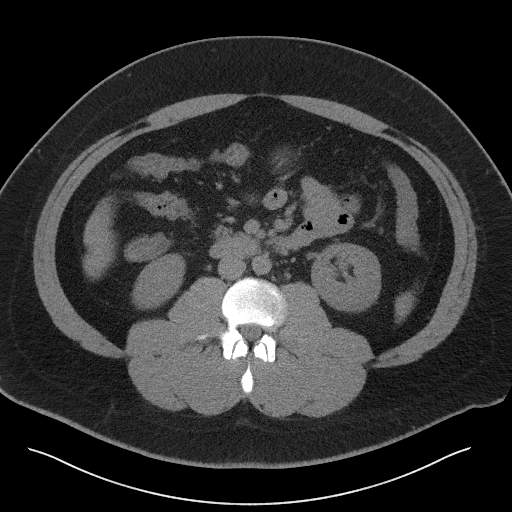
[im 89/126  soft-tissue]
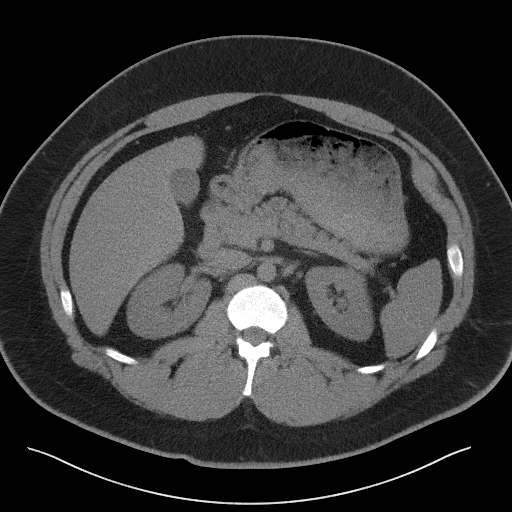
[im 89/126  bone]
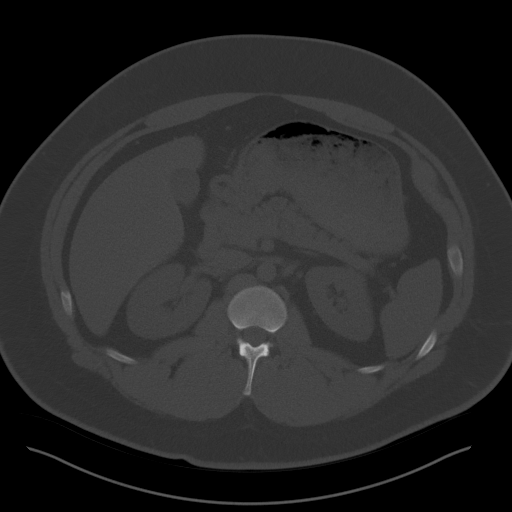
[im 99/126  soft-tissue]
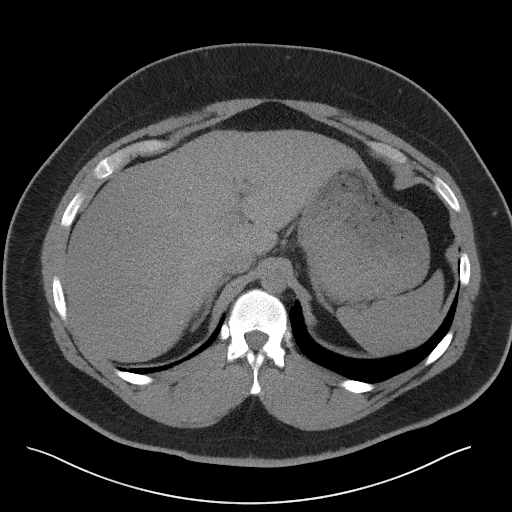
[im 110/126  soft-tissue]
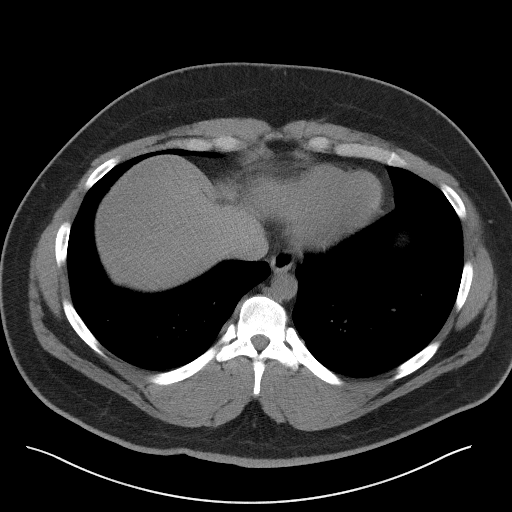
[im 120/126  soft-tissue]
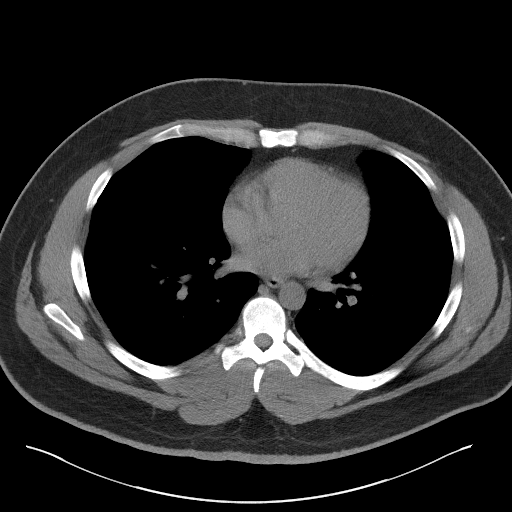

[Series 5: coronal · coronal · 0.86mm/px · 3 of 147 slices shown]
[im 49/147  soft-tissue]
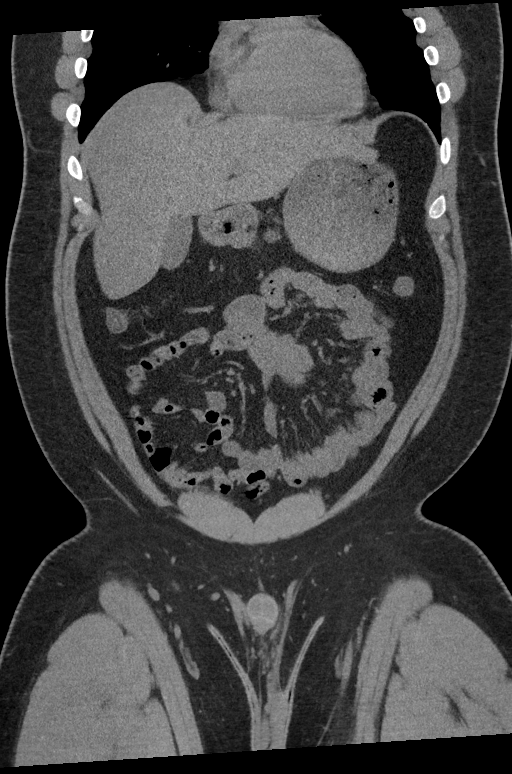
[im 65/147  soft-tissue]
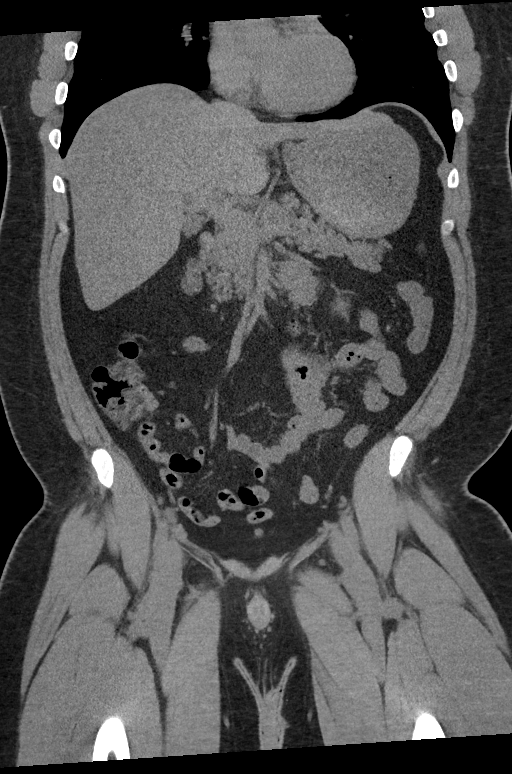
[im 82/147  soft-tissue]
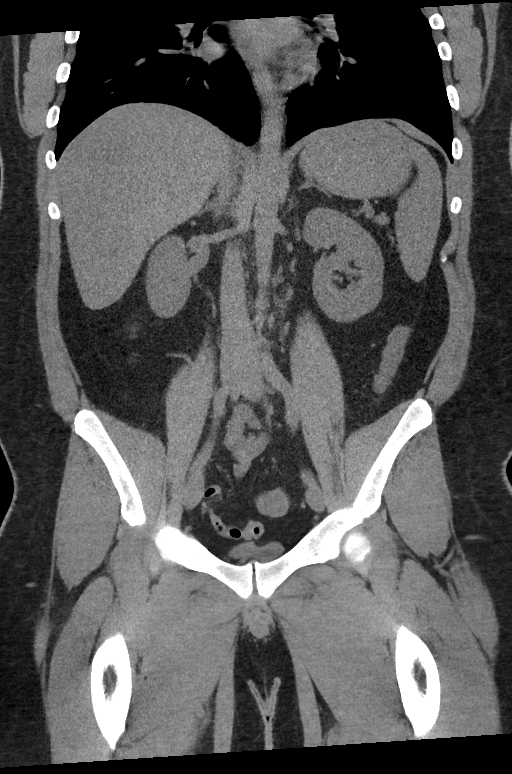

[15 of 46 positions shown; findings below may reference images not displayed]

FINDINGS: Lower chest: Lung bases are clear.

Hepatobiliary: There is hepatic steatosis. No focal liver lesions
are evident on this noncontrast enhanced study. Gallbladder wall is
not appreciably thickened. There is no biliary duct dilatation.

Pancreas: No pancreatic mass or inflammatory focus.

Spleen: No splenic lesions are evident.

Adrenals/Urinary Tract: Adrenals bilaterally appear unremarkable.
Kidneys bilaterally show no evident mass or hydronephrosis on either
side. There is a renal parenchymal defect on the right, an anatomic
variant. There is no appreciable renal or ureteral calculus on
either side. Urinary bladder is midline with wall thickness within
normal limits. There is a urachal remnant without mass or fluid
associated.

Stomach/Bowel: There is no appreciable bowel wall or mesenteric
thickening. There is no evident bowel obstruction. No free air or
portal venous air.

Vascular/Lymphatic: No aortic aneurysm evident. No vascular lesions
are evident on this noncontrast enhanced study. No adenopathy is
appreciable in the abdomen or pelvis.

Reproductive: Prostate and seminal vesicles appear normal in size
and contour. No evident pelvic mass.

Other: Appendix appears normal. There is no abscess or ascites in
the abdomen or pelvis. There is a minimal ventral hernia containing
only fat.

Musculoskeletal: There are no blastic or lytic bone lesions. No
intramuscular or abdominal wall lesions are evident.
IMPRESSION: 1. No evident renal or ureteral calculus. No hydronephrosis
appreciable on either side.

2. No evident bowel obstruction. No abscess evident in the abdomen
or pelvis. Appendix appears normal.

3.  Hepatic steatosis.

4.  Minimal ventral hernia containing only fat.
